# Patient Record
Sex: Female | Born: 1985 | Race: White | Hispanic: No | Marital: Married | Smoking: Former smoker
Health system: Southern US, Community
[De-identification: ages and names within clinical notes are randomized; demographics above are authoritative.]

## PROBLEM LIST (undated history)

## (undated) HISTORY — PX: CHOLECYSTECTOMY: SHX55

## (undated) HISTORY — PX: LAPAROSCOPIC TUBAL LIGATION W/ FILCHIE CLIPS: SHX1938

---

## 2011-01-20 ENCOUNTER — Emergency Department: Payer: Self-pay | Admitting: Emergency Medicine

## 2011-02-14 ENCOUNTER — Emergency Department: Payer: Self-pay | Admitting: Emergency Medicine

## 2011-09-10 ENCOUNTER — Emergency Department (HOSPITAL_COMMUNITY)
Admission: EM | Admit: 2011-09-10 | Discharge: 2011-09-10 | Discharge status: Against Medical Advice | Payer: Medicaid Other | Attending: Emergency Medicine | Admitting: Emergency Medicine

## 2011-09-10 DIAGNOSIS — M545 Low back pain, unspecified: Secondary | ICD-10-CM | POA: Insufficient documentation

## 2011-09-10 DIAGNOSIS — R42 Dizziness and giddiness: Secondary | ICD-10-CM | POA: Insufficient documentation

## 2011-09-10 DIAGNOSIS — R110 Nausea: Secondary | ICD-10-CM | POA: Insufficient documentation

## 2011-09-10 DIAGNOSIS — R1032 Left lower quadrant pain: Secondary | ICD-10-CM | POA: Insufficient documentation

## 2011-09-10 LAB — URINALYSIS, ROUTINE W REFLEX MICROSCOPIC
Bilirubin Urine: NEGATIVE
Glucose, UA: NEGATIVE mg/dL
Hgb urine dipstick: NEGATIVE
Specific Gravity, Urine: 1.023 (ref 1.005–1.030)
pH: 7 (ref 5.0–8.0)

## 2011-09-10 LAB — URINE MICROSCOPIC-ADD ON

## 2014-05-31 ENCOUNTER — Emergency Department: Payer: Self-pay | Admitting: Emergency Medicine

## 2014-05-31 LAB — URINALYSIS, COMPLETE
BACTERIA: NONE SEEN
Bilirubin,UR: NEGATIVE
Glucose,UR: NEGATIVE mg/dL (ref 0–75)
Ketone: NEGATIVE
Nitrite: NEGATIVE
Ph: 5 (ref 4.5–8.0)
RBC,UR: 172 /HPF (ref 0–5)
SPECIFIC GRAVITY: 1.035 (ref 1.003–1.030)
SQUAMOUS EPITHELIAL: NONE SEEN
WBC UR: 1485 /HPF (ref 0–5)

## 2014-06-02 LAB — URINE CULTURE

## 2014-09-06 ENCOUNTER — Emergency Department: Payer: Self-pay | Admitting: Emergency Medicine

## 2014-09-06 LAB — COMPREHENSIVE METABOLIC PANEL
ALK PHOS: 97 U/L
ANION GAP: 11 (ref 7–16)
Albumin: 3.2 g/dL — ABNORMAL LOW (ref 3.4–5.0)
Albumin: 3.2 g/dL — ABNORMAL LOW (ref 3.4–5.0)
Alkaline Phosphatase: 92 U/L
Anion Gap: 8 (ref 7–16)
BILIRUBIN TOTAL: 0.5 mg/dL (ref 0.2–1.0)
BUN: 13 mg/dL (ref 7–18)
BUN: 12 mg/dL (ref 7–18)
Bilirubin,Total: 0.5 mg/dL (ref 0.2–1.0)
CALCIUM: 8.1 mg/dL — AB (ref 8.5–10.1)
CHLORIDE: 108 mmol/L — AB (ref 98–107)
CO2: 25 mmol/L (ref 21–32)
CREATININE: 0.72 mg/dL (ref 0.60–1.30)
Calcium, Total: 7.9 mg/dL — ABNORMAL LOW (ref 8.5–10.1)
Chloride: 102 mmol/L (ref 98–107)
Co2: 21 mmol/L (ref 21–32)
Creatinine: 0.73 mg/dL (ref 0.60–1.30)
EGFR (African American): >60
EGFR (Non-African Amer.): >60
GLUCOSE: 152 mg/dL — AB (ref 65–99)
Glucose: 107 mg/dL — ABNORMAL HIGH (ref 65–99)
OSMOLALITY: 282 (ref 275–301)
Osmolality: 270 (ref 275–301)
POTASSIUM: 3.9 mmol/L (ref 3.5–5.1)
POTASSIUM: 3.4 mmol/L — AB (ref 3.5–5.1)
SGOT(AST): 13 U/L — ABNORMAL LOW (ref 15–37)
SGOT(AST): 20 U/L (ref 15–37)
SGPT (ALT): 26 U/L
SGPT (ALT): 29 U/L
SODIUM: 140 mmol/L (ref 136–145)
Sodium: 135 mmol/L — ABNORMAL LOW (ref 136–145)
TOTAL PROTEIN: 7.3 g/dL (ref 6.4–8.2)
TOTAL PROTEIN: 7.4 g/dL (ref 6.4–8.2)

## 2014-09-06 LAB — LIPASE, BLOOD
LIPASE: 49 U/L — AB (ref 73–393)
LIPASE: 37 U/L — AB (ref 73–393)

## 2014-09-06 LAB — CBC WITH DIFFERENTIAL/PLATELET
BASOS PCT: 0.1 %
Basophil #: 0 10*3/uL (ref 0.0–0.1)
EOS PCT: 0.2 %
Eosinophil #: 0 10*3/uL (ref 0.0–0.7)
HCT: 45.3 % (ref 35.0–47.0)
HGB: 14.1 g/dL (ref 12.0–16.0)
LYMPHS ABS: 1.3 10*3/uL (ref 1.0–3.6)
Lymphocyte %: 5.2 %
MCH: 26.3 pg (ref 26.0–34.0)
MCHC: 31.2 g/dL — AB (ref 32.0–36.0)
MCV: 84 fL (ref 80–100)
MONOS PCT: 6.8 %
Monocyte #: 1.7 x10 3/mm — ABNORMAL HIGH (ref 0.2–0.9)
NEUTROS ABS: 22 10*3/uL — AB (ref 1.4–6.5)
Neutrophil %: 87.7 %
PLATELETS: 412 10*3/uL (ref 150–440)
RBC: 5.37 10*6/uL — ABNORMAL HIGH (ref 3.80–5.20)
RDW: 15.7 % — ABNORMAL HIGH (ref 11.5–14.5)
WBC: 25.1 10*3/uL — AB (ref 3.6–11.0)

## 2014-09-06 LAB — CBC
HCT: 47.6 % — ABNORMAL HIGH (ref 35.0–47.0)
HGB: 14.5 g/dL (ref 12.0–16.0)
MCH: 25.9 pg — ABNORMAL LOW (ref 26.0–34.0)
MCHC: 30.4 g/dL — AB (ref 32.0–36.0)
MCV: 85 fL (ref 80–100)
PLATELETS: 409 10*3/uL (ref 150–440)
RBC: 5.58 10*6/uL — ABNORMAL HIGH (ref 3.80–5.20)
RDW: 16 % — ABNORMAL HIGH (ref 11.5–14.5)
WBC: 18.7 10*3/uL — ABNORMAL HIGH (ref 3.6–11.0)

## 2014-09-06 LAB — URINALYSIS, COMPLETE
Bilirubin,UR: NEGATIVE
Blood: NEGATIVE
Glucose,UR: NEGATIVE mg/dL (ref 0–75)
KETONE: NEGATIVE
Leukocyte Esterase: NEGATIVE
Nitrite: NEGATIVE
Ph: 5 (ref 4.5–8.0)
Protein: NEGATIVE
RBC,UR: 2 /HPF (ref 0–5)
SPECIFIC GRAVITY: 1.013 (ref 1.003–1.030)

## 2014-09-09 ENCOUNTER — Observation Stay: Payer: Self-pay | Admitting: Surgery

## 2014-09-09 LAB — COMPREHENSIVE METABOLIC PANEL
ALK PHOS: 75 U/L
ALT: 24 U/L
Albumin: 2.8 g/dL — ABNORMAL LOW (ref 3.4–5.0)
Anion Gap: 9 (ref 7–16)
BILIRUBIN TOTAL: 0.5 mg/dL (ref 0.2–1.0)
BUN: 7 mg/dL (ref 7–18)
CALCIUM: 7.9 mg/dL — AB (ref 8.5–10.1)
CHLORIDE: 102 mmol/L (ref 98–107)
CO2: 27 mmol/L (ref 21–32)
Creatinine: 0.57 mg/dL — ABNORMAL LOW (ref 0.60–1.30)
EGFR (Non-African Amer.): >60
Glucose: 83 mg/dL (ref 65–99)
OSMOLALITY: 273 (ref 275–301)
POTASSIUM: 3.2 mmol/L — AB (ref 3.5–5.1)
SGOT(AST): 21 U/L (ref 15–37)
Sodium: 138 mmol/L (ref 136–145)
TOTAL PROTEIN: 6.6 g/dL (ref 6.4–8.2)

## 2014-09-09 LAB — CBC WITH DIFFERENTIAL/PLATELET
Basophil #: 0 10*3/uL (ref 0.0–0.1)
Basophil %: 0.4 %
Eosinophil #: 0.3 10*3/uL (ref 0.0–0.7)
Eosinophil %: 4.1 %
HCT: 36 % (ref 35.0–47.0)
HGB: 12 g/dL (ref 12.0–16.0)
LYMPHS PCT: 21.4 %
Lymphocyte #: 1.6 10*3/uL (ref 1.0–3.6)
MCH: 27.6 pg (ref 26.0–34.0)
MCHC: 33.3 g/dL (ref 32.0–36.0)
MCV: 83 fL (ref 80–100)
Monocyte #: 1.1 x10 3/mm — ABNORMAL HIGH (ref 0.2–0.9)
Monocyte %: 14.9 %
Neutrophil #: 4.5 10*3/uL (ref 1.4–6.5)
Neutrophil %: 59.2 %
Platelet: 315 10*3/uL (ref 150–440)
RBC: 4.35 10*6/uL (ref 3.80–5.20)
RDW: 15 % — AB (ref 11.5–14.5)
WBC: 7.6 10*3/uL (ref 3.6–11.0)

## 2014-09-09 LAB — BASIC METABOLIC PANEL
ANION GAP: 5 — AB (ref 7–16)
BUN: 3 mg/dL — ABNORMAL LOW (ref 7–18)
CALCIUM: 7.4 mg/dL — AB (ref 8.5–10.1)
CHLORIDE: 107 mmol/L (ref 98–107)
CO2: 30 mmol/L (ref 21–32)
Creatinine: 0.6 mg/dL (ref 0.60–1.30)
EGFR (African American): >60
EGFR (Non-African Amer.): >60
GLUCOSE: 95 mg/dL (ref 65–99)
OSMOLALITY: 279 (ref 275–301)
POTASSIUM: 3.7 mmol/L (ref 3.5–5.1)
Sodium: 142 mmol/L (ref 136–145)

## 2014-09-09 LAB — LIPASE, BLOOD: Lipase: 323 U/L (ref 73–393)

## 2014-09-10 LAB — CBC WITH DIFFERENTIAL/PLATELET
BASOS ABS: 0 10*3/uL (ref 0.0–0.1)
Basophil %: 0.2 %
EOS PCT: 0.1 %
Eosinophil #: 0 10*3/uL (ref 0.0–0.7)
HCT: 30.8 % — AB (ref 35.0–47.0)
HGB: 9.8 g/dL — AB (ref 12.0–16.0)
LYMPHS PCT: 16.3 %
Lymphocyte #: 1 10*3/uL (ref 1.0–3.6)
MCH: 26.7 pg (ref 26.0–34.0)
MCHC: 31.7 g/dL — AB (ref 32.0–36.0)
MCV: 84 fL (ref 80–100)
MONO ABS: 0.7 x10 3/mm (ref 0.2–0.9)
Monocyte %: 11.6 %
NEUTROS ABS: 4.5 10*3/uL (ref 1.4–6.5)
NEUTROS PCT: 71.8 %
PLATELETS: 291 10*3/uL (ref 150–440)
RBC: 3.65 10*6/uL — ABNORMAL LOW (ref 3.80–5.20)
RDW: 15 % — AB (ref 11.5–14.5)
WBC: 6.2 10*3/uL (ref 3.6–11.0)

## 2014-09-10 LAB — COMPREHENSIVE METABOLIC PANEL
ANION GAP: 6 — AB (ref 7–16)
Albumin: 2.5 g/dL — ABNORMAL LOW (ref 3.4–5.0)
Alkaline Phosphatase: 63 U/L
BILIRUBIN TOTAL: 0.2 mg/dL (ref 0.2–1.0)
BUN: 5 mg/dL — AB (ref 7–18)
CHLORIDE: 105 mmol/L (ref 98–107)
CO2: 29 mmol/L (ref 21–32)
CREATININE: 0.53 mg/dL — AB (ref 0.60–1.30)
Calcium, Total: 7.9 mg/dL — ABNORMAL LOW (ref 8.5–10.1)
GLUCOSE: 149 mg/dL — AB (ref 65–99)
Osmolality: 279 (ref 275–301)
POTASSIUM: 4 mmol/L (ref 3.5–5.1)
SGOT(AST): 34 U/L (ref 15–37)
SGPT (ALT): 36 U/L
Sodium: 140 mmol/L (ref 136–145)
Total Protein: 5.9 g/dL — ABNORMAL LOW (ref 6.4–8.2)

## 2014-09-11 ENCOUNTER — Inpatient Hospital Stay: Payer: Self-pay | Admitting: Surgery

## 2014-09-11 LAB — COMPREHENSIVE METABOLIC PANEL
ALK PHOS: 79 U/L
ANION GAP: 9 (ref 7–16)
Albumin: 2.7 g/dL — ABNORMAL LOW (ref 3.4–5.0)
BUN: 10 mg/dL (ref 7–18)
Bilirubin,Total: 0.2 mg/dL (ref 0.2–1.0)
CO2: 26 mmol/L (ref 21–32)
CREATININE: 0.62 mg/dL (ref 0.60–1.30)
Calcium, Total: 8.1 mg/dL — ABNORMAL LOW (ref 8.5–10.1)
Chloride: 106 mmol/L (ref 98–107)
EGFR (African American): >60
Glucose: 87 mg/dL (ref 65–99)
OSMOLALITY: 280 (ref 275–301)
Potassium: 3.3 mmol/L — ABNORMAL LOW (ref 3.5–5.1)
SGOT(AST): 104 U/L — ABNORMAL HIGH (ref 15–37)
SGPT (ALT): 121 U/L — ABNORMAL HIGH
Sodium: 141 mmol/L (ref 136–145)
Total Protein: 6.2 g/dL — ABNORMAL LOW (ref 6.4–8.2)

## 2014-09-11 LAB — LIPASE, BLOOD
LIPASE: 2525 U/L — AB (ref 73–393)
Lipase: 1550 U/L — ABNORMAL HIGH (ref 73–393)

## 2014-09-11 LAB — CBC
HCT: 31.6 % — AB (ref 35.0–47.0)
HGB: 9.8 g/dL — ABNORMAL LOW (ref 12.0–16.0)
MCH: 26.1 pg (ref 26.0–34.0)
MCHC: 31.1 g/dL — AB (ref 32.0–36.0)
MCV: 84 fL (ref 80–100)
Platelet: 329 10*3/uL (ref 150–440)
RBC: 3.76 10*6/uL — AB (ref 3.80–5.20)
RDW: 15.6 % — AB (ref 11.5–14.5)
WBC: 11.4 10*3/uL — AB (ref 3.6–11.0)

## 2014-09-12 LAB — CBC WITH DIFFERENTIAL/PLATELET
BASOS ABS: 0.1 10*3/uL (ref 0.0–0.1)
BASOS PCT: 0.7 %
EOS ABS: 0.5 10*3/uL (ref 0.0–0.7)
EOS PCT: 4.9 %
HCT: 33 % — ABNORMAL LOW (ref 35.0–47.0)
HGB: 10.8 g/dL — ABNORMAL LOW (ref 12.0–16.0)
Lymphocyte #: 2.7 10*3/uL (ref 1.0–3.6)
Lymphocyte %: 28.9 %
MCH: 27.8 pg (ref 26.0–34.0)
MCHC: 32.8 g/dL (ref 32.0–36.0)
MCV: 85 fL (ref 80–100)
MONO ABS: 0.9 x10 3/mm (ref 0.2–0.9)
Monocyte %: 10.2 %
Neutrophil #: 5.1 10*3/uL (ref 1.4–6.5)
Neutrophil %: 55.3 %
PLATELETS: 327 10*3/uL (ref 150–440)
RBC: 3.9 10*6/uL (ref 3.80–5.20)
RDW: 15.5 % — AB (ref 11.5–14.5)
WBC: 9.2 10*3/uL (ref 3.6–11.0)

## 2014-09-12 LAB — COMPREHENSIVE METABOLIC PANEL
ANION GAP: 9 (ref 7–16)
AST: 74 U/L — AB (ref 15–37)
Albumin: 2.6 g/dL — ABNORMAL LOW (ref 3.4–5.0)
Alkaline Phosphatase: 88 U/L
BILIRUBIN TOTAL: 0.2 mg/dL (ref 0.2–1.0)
BUN: 4 mg/dL — AB (ref 7–18)
CALCIUM: 8 mg/dL — AB (ref 8.5–10.1)
CHLORIDE: 108 mmol/L — AB (ref 98–107)
CREATININE: 0.53 mg/dL — AB (ref 0.60–1.30)
Co2: 26 mmol/L (ref 21–32)
GLUCOSE: 80 mg/dL (ref 65–99)
OSMOLALITY: 281 (ref 275–301)
Potassium: 3.6 mmol/L (ref 3.5–5.1)
SGPT (ALT): 137 U/L — ABNORMAL HIGH
Sodium: 143 mmol/L (ref 136–145)
Total Protein: 6 g/dL — ABNORMAL LOW (ref 6.4–8.2)

## 2014-09-12 LAB — LIPASE, BLOOD: LIPASE: 1334 U/L — AB (ref 73–393)

## 2014-09-12 LAB — CLOSTRIDIUM DIFFICILE(ARMC)

## 2014-09-13 LAB — PATHOLOGY REPORT

## 2014-09-13 LAB — LIPASE, BLOOD: Lipase: 858 U/L — ABNORMAL HIGH (ref 73–393)

## 2015-03-17 NOTE — Discharge Summary (Signed)
PATIENT NAME:  Jasmine Bond, Jasmine Bond MR#:  [id number] DATE OF BIRTH:  [birth year]  DATE OF ADMISSION:  09/09/2014 DATE OF DISCHARGE:  09/10/2014  DISCHARGE DIAGNOSES: Biliary colic with gallstones, obesity.   PROCEDURES: Laparoscopic cholecystectomy.   HISTORY OF PRESENT ILLNESS AND HOSPITAL COURSE:  This is a patient who has been to the emergency room several times. She is in the process of moving back to  to be near family and has had recurrent episodic right upper quadrant pain, chest pain associated with fatty food intolerance and work-up showing gallstones without obvious acute cholecystitis, but she has had multiple emeses and has become dehydrated. Dr. Egbert GaribaldiBird admitted the patient through the emergency room for her emesis and pain.  She was taken to the operating room where laparoscopic cholecystectomy was performed without incident. She made an uncomplicated postoperative recovery. She is tolerating a regular diet. Her H and H has dropped slightly but her vital signs are stable and that is likely dilutional due to her severe dehydration from multiple vomiting episodes preoperatively.   DISCHARGE INSTRUCTIONS: She is discharged in stable condition on oral analgesics to follow up in our office in 10 days or sooner should she have any problems at all. Again, she is not symptomatic from her anemia, and her liver function tests are normal.    ____________________________ Adah Salvageichard E. Excell Seltzerooper, MD rec:jw D: 09/10/2014 08:27:00 ET T: 09/10/2014 18:39:28 ET JOB#: [id number]  cc: Adah Salvageichard E. Excell Seltzerooper, MD, <Dictator> Lattie HawICHARD E Haset O[place] MD ELECTRONICALLY SIGNED 09/19/2014 23:31

## 2015-03-17 NOTE — Discharge Summary (Signed)
PATIENT NAME:  Jasmine Bond, Jasmine Bond MR#:  [id number] DATE OF BIRTH:  [birth year]  DATE OF ADMISSION:  09/11/2014 DATE OF DISCHARGE:  09/13/2014  DISCHARGE DIAGNOSES:  1. Pancreatitis, likely gallstone.  2. History of recent cholecystectomy on 09/09/2014.  3. History of bilateral tubal ligation.  4. History of wisdom teeth surgery.  5. History of urinary tract infection.   DISCHARGE MEDICATIONS: As follows: Norco 1 tab p.o. q.4 hours p.r.n. pain.   INDICATION FOR ADMISSION: Jasmine Bond is a pleasant, 29 year old female who was recently discharged following cholecystectomy. She presents with elevated lipase and epigastric pain. She was admitted for concern for management of pancreatitis and concern for retained common bile duct stone as no cholangiogram was performed. She also had diarrhea, which is improving.   HOSPITAL COURSE: As follows: Jasmine Bond was admitted. She was made n.p.o., given IV fluids and GI was consulted. Throughout hospital course, her pain improved and her pancreatitis resolved. She had an MRCP, which showed no retained stones. She was discharged in satisfactory condition.    DISCHARGE INSTRUCTIONS: As follows: Jasmine Bond is to follow up in approximately 1 week. She is to call or return to the ED if has increased pain, nausea, vomiting, redness, drainage from incisions.    ____________________________ Si Raiderhristopher A. Yeshaya Vath, MD cal:je D: 09/20/2014 07:44:49 ET T: 09/20/2014 12:53:29 ET JOB#: [id number]  cc: Cristal Deerhristopher A. Anthoney Sheppard, MD, <Dictator> Jarvis NewcomerHRISTOPHER A Arval Brandstetter MD ELECTRONICALLY SIGNED 09/26/2014 7:01

## 2015-03-17 NOTE — Consult Note (Signed)
Chief Complaint:  Subjective/Chief Complaint seen for pancreatitis-paint this evening is 6/10.  no emesis, some nausea this am, not now.  passing flatus.   VITAL SIGNS/ANCILLARY NOTES: **Vital Signs.:   20-Oct-15 16:34  Vital Signs Type Q 4hr  Temperature Temperature (F) 98.1  Celsius 36.7  Temperature Source oral  Respirations Respirations 18  Systolic BP Systolic BP 108  Diastolic BP (mmHg) Diastolic BP (mmHg) 71  Mean BP 83  Pulse Ox % Pulse Ox % 99  Pulse Ox Activity Level  At rest  Oxygen Delivery Room Air/ 21 %  *Intake and Output.:   20-Oct-15 04:36  Stool  large semi soft stool   Brief Assessment:  Cardiac Regular   Respiratory clear BS   Gastrointestinal details normal Soft  Nondistended  positive but distant bowel sounds.  tender to palpation at central port site/epigastrum.   Lab Results: Hepatic:  19-Oct-15 06:16   Bilirubin, Total 0.2  Alkaline Phosphatase 79 (46-116 NOTE: New Reference Range 06/13/14)  SGPT (ALT)  121 (14-63 NOTE: New Reference Range 06/13/14)  SGOT (AST)  104  Total Protein, Serum  6.2  Albumin, Serum  2.7  20-Oct-15 04:59   Bilirubin, Total 0.2  Alkaline Phosphatase 88 (46-116 NOTE: New Reference Range 06/13/14)  SGPT (ALT)  137 (14-63 NOTE: New Reference Range 06/13/14)  SGOT (AST)  74  Total Protein, Serum  6.0  Albumin, Serum  2.6  Routine Hem:  19-Oct-15 06:16   WBC (CBC)  11.4  20-Oct-15 04:59   WBC (CBC) 9.2   Radiology Results: MRI:    19-Oct-15 17:47, MRCP MR Cholangiogram  MRCP MR Cholangiogram   REASON FOR EXAM:    pancreatitis, eval for retained stone  COMMENTS:       PROCEDURE: MR  - MR MRCP  - Sep 11 2014  5:47PM     CLINICAL DATA:  Status post cholecystectomy on Saturday, now with  abdominal pain    EXAM:  MRI ABDOMEN WITHOUT AND WITH CONTRAST (INCLUDING MRCP)    TECHNIQUE:  Multiplanar multisequence MR imaging of the abdomen was performed  both before and after the administration of  intravenous contrast.  Heavily T2-weighted images of the biliary and pancreatic ducts were  obtained,and three-dimensional MRCP images were rendered by post  processing.    CONTRAST:  20 mL Multihance IV    COMPARISON:  Right upper quadrant ultrasound dated 09/09/2014    FINDINGS:  Lower chest:  Lung bases are clear.    Hepatobiliary: Liver is within normal limits. No  suspicious/enhancing hepatic lesions. Suspected mild altered  perfusion along the gallbladder fossa (series 17/image 58).  Suspected hemangioma on ultrasound is not evident on MRI.  Status post cholecystectomy. No drainable fluid collection/ seroma  in the surgical bed.    No intrahepatic ductal dilatation. Common duct measures 7.5 mm but  smoothly tapers at the ampulla. No choledocholithiasis is seen.    Pancreas: Within normal limits.    Spleen: Within normal limits.    Adrenals/Urinary Tract: Adrenal glands are unremarkable.    Kidneys are within normal limits.  No hydronephrosis.    Stomach/Bowel: Stomach is unremarkable.  Visualized bowel is within normal limits.    Vascular/Lymphatic: No abdominal aortic aneurysm.    No suspicious abdominal lymphadenopathy.    Other: Trace fluid along the inferior hepatic margin (series 6/image  4).    Musculoskeletal: No focal osseous lesions.     IMPRESSION:  Status post cholecystectomy.    No drainable fluid collection/ seromain the surgical  bed. Trace  infrahepatic ascites.    No intrahepatic ductal dilatation. Common duct measures 7.5 mm but  smoothly tapers at the ampulla. No choledocholithiasis is seen.      Electronically Signed    By: Charline Bills M.D.    On: 09/11/2014 18:33         Verified By: Charline Bills, M.D.,   Assessment/Plan:  Assessment/Plan:  Assessment 1) pancreatitis, likely biliary.  improving, may be able to trial clears tomorrow.   Plan 1) as above.  continue current.   Electronic Signatures: Barnetta Chapel  (MD)  (Signed 20-Oct-15 17:57)  Authored: Chief Complaint, VITAL SIGNS/ANCILLARY NOTES, Brief Assessment, Lab Results, Radiology Results, Assessment/Plan   Last Updated: 20-Oct-15 17:57 by Barnetta Chapel (MD)

## 2015-03-17 NOTE — Consult Note (Signed)
Chief Complaint:  Subjective/Chief Complaint seen for post ccy pancreatitis.  MRCP no evidence of retained cbd stone, but possible passage of sludge or debris causing problem.  pain currently "8/10" but patient appears comfortable, states she is passing flatus, but no stool.  Paucity of bowel sounds, though no high pitch.  mild tenderness in the low epigastric region on exam.  Lipase 1550 this evening, improving.  Continue current. Following.   VITAL SIGNS/ANCILLARY NOTES: **Vital Signs.:   19-Oct-15 19:51  Vital Signs Type Q 4hr  Temperature Temperature (F) 97.4  Celsius 36.3  Pulse Pulse 59  Respirations Respirations 18  Systolic BP Systolic BP 115  Diastolic BP (mmHg) Diastolic BP (mmHg) 74  Mean BP 87  Pulse Ox % Pulse Ox % 98  Pulse Ox Activity Level  At rest  Oxygen Delivery Room Air/ 21 %   Electronic Signatures: Barnetta ChapelSkulskie, Tanajah Boulter (MD)  (Signed 19-Oct-15 20:16)  Authored: Chief Complaint, VITAL SIGNS/ANCILLARY NOTES   Last Updated: 19-Oct-15 20:16 by Barnetta ChapelSkulskie, Rylei Codispoti (MD)

## 2015-03-17 NOTE — Consult Note (Signed)
PATIENT NAME:  Jasmine Bond, [name] MR#:  [id number] DATE OF BIRTH:  31-Dec-1985  DATE OF CONSULTATION:  09/11/2014  REFERRING PHYSICIAN:  Cristal Deerhristopher A. Lundquist, MD  CONSULTING PHYSICIAN:  Keturah Barrehristiane H. Alaa Mullally, NP  REASON FOR CONSULTATION: GI consult ordered by Dr. Juliann PulseLundquist for evaluation of pancreatitis.   HISTORY OF PRESENT ILLNESS: Appreciate consult for A 29 year old Caucasian woman with a history of laparoscopic cholecystectomy, 09/09/2014, by Dr. Excell Seltzerooper for evaluation of pancreatitis. States she did well after surgery and was discharged home yesterday, then developed a strange epigastric pain. York SpanielSaid it is hard to describe.  It does not travel, stays in the upper stomach.  No nausea, vomiting, fever, shortness of breath or other complaints. Today, she reports it is less painful than the right upper quadrant pain, nausea and vomiting that she had with her biliary colic prior to surgery. Was found to have lipase level of 2525 and admitted this morning with pancreatitis, minimal leukocytosis. She states she had diarrhea before the procedure, but this has improved. No bowel movement today. Last was yesterday, was somewhat loose. There is a C. difficile test result pending. LMP now. Surgeon is concerned for possible retained stone. Had IV bolus and fluids ordered. Feeling better with pain medications.   ALLERGIES: No known drug allergies.   PAST MEDICAL AND SURGICAL HISTORY:  Tubal ligation, cholecystectomy, UTI,   kidney infection.  FAMILY HISTORY: Gallbladder disease in several family members. No colon cancer or other GI illnesses.   SOCIAL HISTORY: No tobacco, rare alcohol. No illicits. Does say she stopped smoking a week ago.   REVIEW OF SYSTEMS:  Ten systems reviewed, unremarkable other than what is noted above.   MEDICATIONS: No regular medications. Is taking p.r.n. Vicodin at home.   LABORATORY DATA: Most recent labs: Glucose 87, BUN 10, creatinine 0.62, sodium 141, potassium 3.3, GFR greater  than 60, calcium 8.1, lipase 2525, total protein 6.2, albumin 2.7, total bilirubin 0.2, ALP 79, AST 104, ALT 121, WBC 11.4, hemoglobin 9.8, hematocrit 31.6, platelet count 329,000.    PHYSICAL EXAMINATION:  MOST RECENT VITAL SIGNS: Temperature 97.5, pulse 63, respiratory rate 16, blood pressure 101/68, oxygen saturation 99% on room air.  GENERAL: Well-appearing young woman resting comfortably in no acute distress in bed.  HEENT: Normocephalic, atraumatic. Sclerae clear, conjunctivae pink.  NECK: Supple. No JVD or thyromegaly.  CHEST: Respirations eupneic. Lungs clear.  CARDIAC: S1, S2, RRR. No MRG. No appreciable edema.  ABDOMEN: Five small incisions with Steri-Strips intact, clean, and dry. A small bruise to the left side of the abdomen, soft. Bowel sounds x 4. Tenderness around the epigastrium. No guarding. No rigidity. No rebound tenderness or peritoneal signs. Unable to appreciate hepatosplenomegaly.  SKIN: Warm, dry, pink. No erythema, lesion, or rash. Incision shows above.  NEUROLOGICAL: Alert, oriented x 3. Speech clear. No facial droop.  PSYCHIATRIC: Calm, cooperative. Reasonable insight.   IMPRESSION AND PLAN: Pancreatitis. Agree with suspicion for  retained stone or sludge. Agree with present orders for fluids and pain management. Recommend MRCP. If evidence of stone, may require a ERCP.   Thank you very much for this consult.   These services were provided by Vevelyn Pathristiane Kele Barthelemy, NP, under collaborative agreement with Christena DeemMartin U. Skulskie, MD with whom I discussed this patient in full.    ____________________________ Keturah Barrehristiane H. Tahani Potier, NP chl:LT D: 09/11/2014 17:10:00 ET T: 09/11/2014 17:42:15 ET JOB#: [id number]  cc: Keturah Barrehristiane H. Zarianna Dicarlo, NP, <Dictator> Eustaquio MaizeHRISTIANE H Brea Coleson FNP ELECTRONICALLY SIGNED 10/16/2014 17:11

## 2015-03-17 NOTE — Op Note (Signed)
PATIENT NAME:  Margit BandaSHULL, Reyne MR#:  [id number] DATE OF BIRTH:  [birth year]  DATE OF PROCEDURE:  09/09/2014  PREOPERATIVE DIAGNOSIS: Symptomatic cholelithiasis.   POSTOPERATIVE DIAGNOSIS: Symptomatic cholelithiasis.   PROCEDURE: Laparoscopic cholecystectomy.   SURGEON: Nashea Chumney E. Excell Seltzerooper, MD.   ANESTHESIA: General with endotracheal tube.   INDICATIONS: This is a patient with a history of recurrent epigastric and right upper quadrant pain with multiple episodes of nausea and vomiting. Her pain and nausea has been unrelenting at this ER visit; therefore, we decided to proceed with laparoscopic cholecystectomy. I discussed with her the options and the rationale for offering surgery at this point, which she chose and the risks of bleeding, infection, conversion to an open procedure, bile duct damage, bile duct leak, retained common bile duct stone or bowel injury requiring additional surgery. She understood and agreed to proceed.   FINDINGS: Edema of the gallbladder, a small cystic duct and gallstones.   DESCRIPTION OF PROCEDURE: The patient was induced with general anesthesia, given IV antibiotics. VTE prophylaxis was in place. She was prepped and draped in a sterile fashion. Marcaine was infiltrated in the skin and subcutaneous tissues around the periumbilical area. Incision was made. Veress needle was placed. Pneumoperitoneum was obtained. A 5 mm trocar port was placed. The abdominal cavity was explored and under direct vision, a 10 mm epigastric port and 2 lateral 5 mm ports were placed. The gallbladder was placed on tension. The peritoneum over the infundibulum was incised bluntly. The cystic duct and gallbladder junction was well identified. The cystic lymphatics were doubly clipped and divided. This allowed for good visualization of the cystic duct as it entered the infundibulum. Here, it was doubly clipped and divided and then the cystic artery was doubly clipped and divided.  The gallbladder was  taken from the gallbladder fossa with electrocautery and passed out through the epigastric port site with the aid of an Endo Catch bag. The area was checked for hemostasis. There was no sign of bleeding, bile leak or bowel injury; therefore, the camera was placed in the epigastric site to view back to the periumbilical site. There is no sign of bowel injury. Irrigation was performed and again checked for hemostasis and bowel injury and none was identified. Therefore, pneumoperitoneum was released. All ports were removed. Fascial edges at the epigastric site were approximated with figure-of-eight 0 Vicryl, made difficult by this patient's morbid obesity and then 4-0 subcuticular Monocryl was used on all skin edges. Steri-Strips, Mastisol and sterile dressings were placed. The patient tolerated the procedure well. There were no complications. She was taken to the recovery room in stable condition to be admitted for continued care.     ____________________________ Adah Salvageichard E. Excell Seltzerooper, MD rec:TT D: 09/09/2014 13:58:02 ET T: 09/09/2014 18:14:10 ET JOB#: [id number]  cc: Adah Salvageichard E. Excell Seltzerooper, MD, <Dictator> Lattie HawICHARD E Rice Walsh MD ELECTRONICALLY SIGNED 09/10/2014 18:59

## 2015-03-17 NOTE — Consult Note (Signed)
Brief Consult Note: Diagnosis: pancreatitis.   Patient was seen by consultant.   Consult note dictated.   Comments: Appreciate consult for 28y/o caucasian woman with history of lap chole 09/09/14 by Dr Excell Seltzerooper, for evaluation of pancreatitis: states she did well after surgery and was dc'd home yesterday, then developed a strange epigastric pain: says it is hard to describe. Doesn't travel, stays in the upper stomach. No NV, fever, sob, or other complaints. Today she reports it is less painful that the RUQpain/NV that she had with her biliary colic prior to surgery. Was found to have lipase level 2525 and admitted this am with pancreatitis. Minimal leukocytosis. States she had diarrhea before the procedure, no bm today: last was yest & loose. c-diff test ordered. LMP now. Surgeon is concerned for possible retained stone. Had iv bolus and fluids ordered. Feeling better with pain medication.  Impression/plaN: Pancreatitis: agree with suspicion of retained stone or sludge. Agree with present orders for fluids and pain mgmt.  Recommend MRCP- ir evidence of stone, may require ERCP.  Electronic Signatures: Vevelyn PatLondon, Halim Surrette H (NP)  (Signed 19-Oct-15 14:34)  Authored: Brief Consult Note   Last Updated: 19-Oct-15 14:34 by Keturah BarreLondon, Aleasha Fregeau H (NP)

## 2015-03-17 NOTE — H&P (Signed)
   Subjective/Chief Complaint Epigastric pain x 1 day, persistent diarrhea   History of Present Illness Jasmine Bond is a pleasant 29 yo F who presents with 1 day of severe epigastric pain s/p cholecystectomy.  She says that her pain is worse than her RUQ pain which was postprandial and which resolved following cholecystectomy.  Tolerating diet now.  + diarrhea which is resolving.  Pain improved now with pain meds.  Radiates to back.  Lipase is 2500, was mildly elevated to 300s prior to surgery.  Denies EtOH use   Past History S/p cholecystectomy on 10/17 H/o BTL H/o wisdom teeth surgery H/o UTI   Code Status Full Code   Past Med/Surgical Hx:  kidney infection:   Urinary Tract Infection:   Gall Bladder:   Tubal Ligation:   ALLERGIES:  No Known Allergies:   Family and Social History:  Family History Non-Contributory   Social History negative tobacco, negative ETOH   + Tobacco Current (within 1 year)   Place of Living Home   Review of Systems:  Subjective/Chief Complaint Epigastric pain ,diarrhea   Fever/Chills Yes  Chills   Cough No   Sputum No   Abdominal Pain Yes   Diarrhea Yes   Constipation No   Nausea/Vomiting No   SOB/DOE No   Dysuria No   Tolerating PT Yes   Tolerating Diet Yes   Physical Exam:  GEN well developed, well nourished, no acute distress, obese   HEENT pink conjunctivae, PERRL, moist oral mucosa   RESP normal resp effort  clear BS  no use of accessory muscles   CARD regular rate   ABD positive tenderness  no liver/spleen enlargement  no hernia  soft  normal BS   EXTR negative cyanosis/clubbing, negative edema   SKIN normal to palpation, No rashes, No ulcers   NEURO cranial nerves intact, negative tremor, follows commands, strength:, motor/sensory function intact   PSYCH A+O to time, place, person, good insight    Assessment/Admission Diagnosis Jasmine Bond is a pleasant 29 yo F who presents with acute pancreatitis. Concern for  retained CBD stone as no cholangiogram performed.  Also with resolving diarrhea.   Plan NPO, IVF, GI consult.  Stool for c diff.  Labs in am.   Electronic Signatures: Jarvis NewcomerLundquist, Nysa Sarin A (MD)  (Signed 19-Oct-15 08:00)  Authored: CHIEF COMPLAINT and HISTORY, PAST MEDICAL/SURGIAL HISTORY, ALLERGIES, FAMILY AND SOCIAL HISTORY, REVIEW OF SYSTEMS, PHYSICAL EXAM, ASSESSMENT AND PLAN   Last Updated: 19-Oct-15 08:00 by Jarvis NewcomerLundquist, Desean Heemstra A (MD)

## 2015-03-17 NOTE — H&P (Signed)
Subjective/Chief Complaint abdominal pain, nausea and vomiting.   History of Present Illness 29 y/o female presents to this ER for a third visit in less than a week with post-prandial epigastric and RUQ pain associated with nausea, vomiting and some diarrhea.  Denies prior similar episodes.  Work-up here initially showed WBC 25K, nl LFT's and [place] with large gallstone.  She is in the process of moving to [place] and was seen in follow-up at her PCP in [place] who then referred her to a surgeon and she is actually supposed to have cholecystectomy Monday in [place], [place].  She signed herself out there at the time of the visit as surgery was scheduled the next day actually due to social reasons and returned to [facility].  Presents last night with recurrent epigastric and RUQ abdominal pain radiating into  her back again with n/v/d.  No fevers, no jaundice, no sick contacts.  She reports that an US done in [place] showed pericholecystic fluid.  repeated US done at this most recent visit only shows stones.  LFT"s nl and WBC normal but K 3.2.  Unable to control her pain and nausea with oral meds.   Past History B[facility] [place] teeth surgery   Code Status Full Code   Past Med/Surgical Hx:  kidney infection:   Urinary Tract Infection:   Tubal Ligation:   ALLERGIES:  No Known Allergies:   HOME MEDICATIONS: Medication Instructions Status  acetaminophen-HYDROcodone 325 mg-5 mg oral tablet 1-2 tab(s) by mouth every 4-6 hours as needed for severe pain. Active  Zofran ODT 4 mg oral tablet, disintegrating 1 tab(s)  every 8 hours as needed   Active  Levaquin 500 mg oral tablet 1 tab(s) orally every 24 hours Active   Family and Social History:  Family History Non-Contributory   Social History negative tobacco, negative ETOH, negative Illicit drugs   Place of Living Home   Review of Systems:  Subjective/Chief Complaint see HPI details above.   Tolerating Diet No  Nauseated  Vomiting   Physical Exam:  GEN no acute distress,  obese, disheveled   HEENT pale conjunctivae, PERRL   NECK supple   RESP normal resp effort  clear BS   CARD regular rate   ABD positive tenderness  denies tenderness  soft  tender in epigastrum.   LYMPH negative neck   EXTR negative cyanosis/clubbing   SKIN normal to palpation   NEURO cranial nerves intact   PSYCH A+O to time, place, person, good insight   Lab Results: Hepatic:  17-Oct-15 02:00   Bilirubin, Total 0.5  Alkaline Phosphatase 75 (46-116 NOTE: New Reference Range 06/13/14)  SGPT (ALT) 24 (14-63 NOTE: New Reference Range 06/13/14)  SGOT (AST) 21  Total Protein, Serum 6.6  Albumin, Serum  2.8  Routine Chem:  17-Oct-15 02:00   Lipase 323 (Result(s) reported on 09 Sep 2014 at 02:55AM.)  Glucose, Serum 83  BUN 7  Creatinine (comp)  0.57  Sodium, Serum 138  Potassium, Serum  3.2  Chloride, Serum 102  CO2, Serum 27  Calcium (Total), Serum  7.9  Osmolality (calc) 273  eGFR (African American) >60  eGFR (Non-African American) >60 (eGFR values <41m/min/1.73 m2 may be an indication of chronic kidney disease (CKD). Calculated eGFR, using the MRDR Study equation, is useful in  patients with stable renal function. The eGFR calculation will not be reliable in acutely ill patients when serum creatinine is changing rapidly. It is not useful in patients on dialysis. The eGFR calculation may not  be applicable to patients at the low and high extremes of body sizes, pregnant women, and vetetarians.)  Anion Gap 9  Routine Hem:  17-Oct-15 02:00   WBC (CBC) 7.6  RBC (CBC) 4.35  Hemoglobin (CBC) 12.0  Hematocrit (CBC) 36.0  Platelet Count (CBC) 315  MCV 83  MCH 27.6  MCHC 33.3  RDW  15.0  Neutrophil % 59.2  Lymphocyte % 21.4  Monocyte % 14.9  Eosinophil % 4.1  Basophil % 0.4  Neutrophil # 4.5  Lymphocyte # 1.6  Monocyte #  1.1  Eosinophil # 0.3  Basophil # 0.0 (Result(s) reported on 09 Sep 2014 at 02:14AM.)   Radiology Results: [place]:    17-Oct-15  03:21, US Abdomen Limited Survey  US Abdomen Limited Survey  REASON FOR EXAM:    persistent N/V and pain w/ known cholelithiasis, r/o   acute cholecystitis  COMMENTS:   Body Site: Gallbladder, Liver, Common Bile Duct    PROCEDURE: [place]  - US ABDOMEN LIMITED SURVEY  - Sep 09 2014  3:21AM     CLINICAL DATA:  Persistent nausea and vomiting. Pain and known  cholelithiasis. Evaluate for cholecystitis.    EXAM:  US ABDOMEN LIMITED - RIGHT UPPER QUADRANT    COMPARISON:  09/07/2014    FINDINGS:  Gallbladder:    Mobile gallstone.  No wall thickening or focal tenderness.    Common bile duct:    Diameter: 5 to 6 mm, which is borderline dilated. Where visualized,  no filling defect.    Liver:    There is echogenic mass within the central right liver measuring 1  cm. No shadowing or halo.    Trace right pleural effusion.   IMPRESSION:  1. Mobile cholelithiasis.  No acute cholecystitis.  2. Trace right pleural effusion.  3. 1 cm mass in the right liver, appearance favoring hemangioma.      Electronically Signed    By: Jorje Guild M.D.    On: 09/09/2014 03:34         Verified By: Gilford Silvius, M.D.,  LabUnknown:  PACS Image    Assessment/Admission Diagnosis 29 y/o female with biliary colic and failure of OP management. hypokalemia secondary to GI losses.   Plan admit, hydrate and replete K Will discuss feasibility of surgical intervention during this admission.   Electronic Signatures: Sherri Rad (MD)  (Signed 17-Oct-15 06:28)  Authored: CHIEF COMPLAINT and HISTORY, PAST MEDICAL/SURGIAL HISTORY, ALLERGIES, HOME MEDICATIONS, FAMILY AND SOCIAL HISTORY, REVIEW OF SYSTEMS, PHYSICAL EXAM, LABS, Radiology, ASSESSMENT AND PLAN   Last Updated: 17-Oct-15 06:28 by Sherri Rad (MD)

## 2015-05-20 ENCOUNTER — Emergency Department

## 2015-05-20 ENCOUNTER — Encounter: Payer: Self-pay | Admitting: Emergency Medicine

## 2015-05-20 ENCOUNTER — Emergency Department
Admission: EM | Admit: 2015-05-20 | Discharge: 2015-05-21 | Disposition: A | Discharge status: Home / Self Care | Attending: Emergency Medicine | Admitting: Emergency Medicine

## 2015-05-20 DIAGNOSIS — Z3202 Encounter for pregnancy test, result negative: Secondary | ICD-10-CM | POA: Insufficient documentation

## 2015-05-20 DIAGNOSIS — R102 Pelvic and perineal pain: Secondary | ICD-10-CM | POA: Diagnosis not present

## 2015-05-20 DIAGNOSIS — Z720 Tobacco use: Secondary | ICD-10-CM | POA: Insufficient documentation

## 2015-05-20 LAB — URINALYSIS COMPLETE WITH MICROSCOPIC (ARMC ONLY)
Bacteria, UA: NONE SEEN
Bilirubin Urine: NEGATIVE
GLUCOSE, UA: NEGATIVE mg/dL
Hgb urine dipstick: NEGATIVE
KETONES UR: NEGATIVE mg/dL
Leukocytes, UA: NEGATIVE
Nitrite: NEGATIVE
PH: 5 (ref 5.0–8.0)
PROTEIN: NEGATIVE mg/dL
SPECIFIC GRAVITY, URINE: 1.02 (ref 1.005–1.030)

## 2015-05-20 LAB — ABO/RH: ABO/RH(D): A POS

## 2015-05-20 LAB — CBC
HCT: 39.3 % (ref 35.0–47.0)
Hemoglobin: 12.9 g/dL (ref 12.0–16.0)
MCH: 26.8 pg (ref 26.0–34.0)
MCHC: 32.8 g/dL (ref 32.0–36.0)
MCV: 81.7 fL (ref 80.0–100.0)
Platelets: 382 10*3/uL (ref 150–440)
RBC: 4.81 MIL/uL (ref 3.80–5.20)
RDW: 15.7 % — AB (ref 11.5–14.5)
WBC: 10.8 10*3/uL (ref 3.6–11.0)

## 2015-05-20 LAB — POCT PREGNANCY, URINE: PREG TEST UR: NEGATIVE

## 2015-05-20 LAB — HCG, QUANTITATIVE, PREGNANCY: hCG, Beta Chain, Quant, S: <1 m[IU]/mL (ref <5)

## 2015-05-20 NOTE — ED Provider Notes (Signed)
[facility] Emergency Department Provider Note  ____________________________________________  Time seen: Approximately 11:03 PM  I have reviewed the triage vital signs and the nursing notes.   HISTORY  Chief Complaint Abdominal Pain and Pelvic Pain    HPI Jasmine Bond is a 29 y.o. female status post bilateral tubal ligation and cholecystectomy who presents with pelvic pain for several days and some spotting and mucus-like vaginal discharge.  She states that the symptoms were gradual in onset, the pain is mild and intermittent cramping, and it feels like when she has been pregnant in the past.  She adamantly reports that she has 4 living children and in each case her pregnancy tests were negative until she was at about 8 weeks.  She is very concerned that she may be pregnant today in spite of her negative pregnancy test at her tubal ligation.  She has no other abdominal pain, no nausea/vomiting, no chest pain, no shortness of breath, no fever/chills.  She thinks the pain better and nothing makes it worse.  She states that she is overdue for her menstrual period.   History reviewed. No pertinent past medical history.  There are no active problems to display for this patient.   Past Surgical History  Procedure Laterality Date  . Laparoscopic tubal ligation w/ filchie clips    . Cholecystectomy      No current outpatient prescriptions on file.  Allergies Review of patient's allergies indicates no known allergies.  No family history on file.  Social History History  Substance Use Topics  . Smoking status: Current Every Day Smoker -- 1.00 packs/day    Types: Cigarettes  . Smokeless tobacco: Not on file  . Alcohol Use: No    Review of Systems Constitutional: No fever/chills Eyes: No visual changes. ENT: No sore throat. Cardiovascular: Denies chest pain. Respiratory: Denies shortness of breath. Gastrointestinal: Lower pelvic cramping, left worse than  right.  No nausea, no vomiting.  No diarrhea.  No constipation. Genitourinary: Negative for dysuria.  Minimal vaginal discharge with some bleeding Musculoskeletal: Negative for back pain. Skin: Negative for rash. Neurological: Negative for headaches, focal weakness or numbness.  10-point ROS otherwise negative.  ____________________________________________   PHYSICAL EXAM:  VITAL SIGNS: ED Triage Vitals  Enc Vitals Group     BP 05/20/15 2002 130/74 mmHg     Pulse Rate 05/20/15 2002 70     Resp 05/20/15 2002 19     Temp 05/20/15 2002 98.1 F (36.7 C)     Temp Source 05/20/15 2002 Oral     SpO2 05/20/15 2002 98 %     Weight 05/20/15 2002 240 lb (108.863 kg)     Height 05/20/15 2002  (1.626 m)     Head Cir --      Peak Flow --      Pain Score 05/20/15 2005 10     Pain Loc --      Pain Edu? --      Excl. in GC? --     Constitutional: Alert and oriented. Well appearing and in no acute distress. Eyes: Conjunctivae are normal. PERRL. EOMI. Head: Atraumatic. Nose: No congestion/rhinnorhea. Mouth/Throat: Mucous membranes are moist.  Oropharynx non-erythematous. Neck: No stridor.   Cardiovascular: Normal rate, regular rhythm. Grossly normal heart sounds.  Good peripheral circulation. Respiratory: Normal respiratory effort.  No retractions. Lungs CTAB. Gastrointestinal: Obese, Soft and nontender. No distention. No abdominal bruits. No CVA tenderness. Genitourinary:  Deferred at the patient's preference Musculoskeletal: No lower extremity  tenderness nor edema.  No joint effusions. Neurologic:  Normal speech and language. No gross focal neurologic deficits are appreciated. Speech is normal. Skin:  Skin is warm, dry and intact. No rash noted. Psychiatric: Mood and affect are normal. Speech and behavior are normal.  ____________________________________________   LABS (all labs ordered are listed, but only abnormal results are displayed)  Labs Reviewed  CBC - Abnormal;  Notable for the following:    RDW 15.7 (*)    All other components within normal limits  URINALYSIS COMPLETEWITH MICROSCOPIC ([facility] ONLY) - Abnormal; Notable for the following:    Color, Urine YELLOW (*)    APPearance CLEAR (*)    Squamous Epithelial / LPF 0-5 (*)    All other components within normal limits  HCG, QUANTITATIVE, PREGNANCY  POC URINE PREG, ED  POCT PREGNANCY, URINE  ABO/RH  ABO/RH   ____________________________________________  EKG  Not indicated ____________________________________________  RADIOLOGY  US Transvaginal Non-ob  05/21/2015   CLINICAL DATA:  Left pelvic pain, onset last night.  EXAM: TRANSABDOMINAL AND TRANSVAGINAL ULTRASOUND OF PELVIS  TECHNIQUE: Both transabdominal and transvaginal ultrasound examinations of the pelvis were performed. Transabdominal technique was performed for global imaging of the pelvis including uterus, ovaries, adnexal regions, and pelvic cul-de-sac. It was necessary to proceed with endovaginal exam following the transabdominal exam to visualize the ovary.  COMPARISON:  01/20/2011  FINDINGS: Uterus  Measurements: 9.3 x 4.1 x 6.3 cm. No fibroids or other mass visualized.  Endometrium  Thickness: 10.7 mm.  No focal abnormality visualized.  Right ovary  Measurements: 3.5 x 1.9 x 1.9 cm. Normal appearance/no adnexal mass.  Left ovary  Measurements: 3.0 x 1.4 x 1.7 cm. Normal appearance/no adnexal mass.  Other findings  Trace free pelvic fluid.  IMPRESSION: No significant abnormality.   Electronically Signed   By: Ellery Plunk M.D.   On: 05/21/2015 00:35   US Pelvis Complete  05/21/2015   CLINICAL DATA:  Left pelvic pain, onset last night.  EXAM: TRANSABDOMINAL AND TRANSVAGINAL ULTRASOUND OF PELVIS  TECHNIQUE: Both transabdominal and transvaginal ultrasound examinations of the pelvis were performed. Transabdominal technique was performed for global imaging of the pelvis including uterus, ovaries, adnexal regions, and pelvic cul-de-sac. It  was necessary to proceed with endovaginal exam following the transabdominal exam to visualize the ovary.  COMPARISON:  01/20/2011  FINDINGS: Uterus  Measurements: 9.3 x 4.1 x 6.3 cm. No fibroids or other mass visualized.  Endometrium  Thickness: 10.7 mm.  No focal abnormality visualized.  Right ovary  Measurements: 3.5 x 1.9 x 1.9 cm. Normal appearance/no adnexal mass.  Left ovary  Measurements: 3.0 x 1.4 x 1.7 cm. Normal appearance/no adnexal mass.  Other findings  Trace free pelvic fluid.  IMPRESSION: No significant abnormality.   Electronically Signed   By: Ellery Plunk M.D.   On: 05/21/2015 00:35    ____________________________________________   PROCEDURES  Procedure(s) performed: None  Critical Care performed: No ____________________________________________   INITIAL IMPRESSION / ASSESSMENT AND PLAN / ED COURSE  Pertinent labs & imaging results that were available during my care of the patient were reviewed by me and considered in my medical decision making (see chart for details).  The patient is nontender upon abdominal exam.  I discussed with her how unlikely it is that she would be pregnant in the setting of a negative pregnancy test and a history of a tubal ligation, but she is very concerned that this may be happening.  I will obtain a transvaginal ultrasound to  help provide reassurance to her, and I stressed to her the need for outpatient follow-up and explained that we cannot provide an ultrasound every time similar symptoms occur in the future.  She understands and agrees.   (Note that documentation was delayed due to multiple ED patients requiring immediate care.)   The patient's ultrasound was unremarkable.  I gave her my usual and customary return precautions ____________________________________________  FINAL CLINICAL IMPRESSION(S) / ED DIAGNOSES  Final diagnoses:  Pelvic pain in female      NEW MEDICATIONS STARTED DURING THIS VISIT:  New Prescriptions    No medications on file     Loleta Rose, MD 05/21/15 [phone]

## 2015-05-20 NOTE — ED Notes (Signed)
Pt ambulatory to triage with c/o LLQ pain, pelvic pain and lower back pain. Pt states my "tubes are clamped", pt reports late period with spotting yesterday and pink mucous. Pt unsure if pregnant.

## 2015-05-21 LAB — ABO/RH: ABO/RH(D): A POS

## 2015-05-21 NOTE — Discharge Instructions (Signed)
Pelvic Pain Female pelvic pain can be caused by many different things and start from a variety of places. Pelvic pain refers to pain that is located in the lower half of the abdomen and between your hips. The pain may occur over a short period of time (acute) or may be reoccurring (chronic). The cause of pelvic pain may be related to disorders affecting the female reproductive organs (gynecologic), but it may also be related to the bladder, kidney stones, an intestinal complication, or muscle or skeletal problems. Getting help right away for pelvic pain is important, especially if there has been severe, sharp, or a sudden onset of unusual pain. It is also important to get help right away because some types of pelvic pain can be life threatening.  CAUSES  Below are only some of the causes of pelvic pain. The causes of pelvic pain can be in one of several categories.   Gynecologic.  Pelvic inflammatory disease.  Sexually transmitted infection.  Ovarian cyst or a twisted ovarian ligament (ovarian torsion).  Uterine lining that grows outside the uterus (endometriosis).  Fibroids, cysts, or tumors.  Ovulation.  Pregnancy.  Pregnancy that occurs outside the uterus (ectopic pregnancy).  Miscarriage.  Labor.  Abruption of the placenta or ruptured uterus.  Infection.  Uterine infection (endometritis).  Bladder infection.  Diverticulitis.  Miscarriage related to a uterine infection (septic abortion).  Bladder.  Inflammation of the bladder (cystitis).  Kidney stone(s).  Gastrointestinal.  Constipation.  Diverticulitis.  Neurologic.  Trauma.  Feeling pelvic pain because of mental or emotional causes (psychosomatic).  Cancers of the bowel or pelvis. EVALUATION  Your caregiver will want to take a careful history of your concerns. This includes recent changes in your health, a careful gynecologic history of your periods (menses), and a sexual history. Obtaining your family  history and medical history is also important. Your caregiver may suggest a pelvic exam. A pelvic exam will help identify the location and severity of the pain. It also helps in the evaluation of which organ system may be involved. In order to identify the cause of the pelvic pain and be properly treated, your caregiver may order tests. These tests may include:   A pregnancy test.  Pelvic ultrasonography.  An X-ray exam of the abdomen.  A urinalysis or evaluation of vaginal discharge.  Blood tests. HOME CARE INSTRUCTIONS   Only take over-the-counter or prescription medicines for pain, discomfort, or fever as directed by your caregiver.   Rest as directed by your caregiver.   Eat a balanced diet.   Drink enough fluids to make your urine clear or pale yellow, or as directed.   Avoid sexual intercourse if it causes pain.   Apply warm or cold compresses to the lower abdomen depending on which one helps the pain.   Avoid stressful situations.   Keep a journal of your pelvic pain. Write down when it started, where the pain is located, and if there are things that seem to be associated with the pain, such as food or your menstrual cycle.  Follow up with your caregiver as directed.  SEEK MEDICAL CARE IF:  Your medicine does not help your pain.  You have abnormal vaginal discharge. SEEK IMMEDIATE MEDICAL CARE IF:   You have heavy bleeding from the vagina.   Your pelvic pain increases.   You feel light-headed or faint.   You have chills.   You have pain with urination or blood in your urine.   You have uncontrolled diarrhea   or vomiting.   You have a fever or persistent symptoms for more than 3 days.  You have a fever and your symptoms suddenly get worse.   You are being physically or sexually abused.  MAKE SURE YOU:  Understand these instructions.  Will watch your condition.  Will get help if you are not doing well or get worse. Document Released:  10/07/2004 Document Revised: 03/27/2014 Document Reviewed: 03/01/2012 Exit[employer] Patient Information 2015 [employer]. This information is not intended to replace advice given to you by your health care provider. Make sure you discuss any questions you have with your health care provider.  

## 2015-05-21 NOTE — ED Notes (Addendum)
Pt uprite on stretcher in exam room with no distress noted; pt reports left "ovarian" pain with vag spotting since yesterday; pt reports that she is concerned that she is pregnant, stating "I have always had a negative urine and blood test with all of my pregnancies and the only way they know is if I have an ultrasound"; abd soft, nondist, slightly tender to left lower with palpation; pt denies any accomp symptoms

## 2016-01-22 ENCOUNTER — Encounter: Payer: Self-pay | Admitting: Emergency Medicine

## 2016-01-22 ENCOUNTER — Emergency Department: Payer: Medicaid Other

## 2016-01-22 DIAGNOSIS — R079 Chest pain, unspecified: Secondary | ICD-10-CM | POA: Diagnosis present

## 2016-01-22 DIAGNOSIS — R0789 Other chest pain: Secondary | ICD-10-CM | POA: Diagnosis not present

## 2016-01-22 DIAGNOSIS — Z87891 Personal history of nicotine dependence: Secondary | ICD-10-CM | POA: Diagnosis not present

## 2016-01-22 LAB — BASIC METABOLIC PANEL
Anion gap: 8 (ref 5–15)
BUN: 14 mg/dL (ref 6–20)
CO2: 26 mmol/L (ref 22–32)
Calcium: 8.8 mg/dL — ABNORMAL LOW (ref 8.9–10.3)
Chloride: 105 mmol/L (ref 101–111)
Creatinine, Ser: 0.79 mg/dL (ref 0.44–1.00)
GFR calc Af Amer: >60 mL/min (ref >60)
GFR calc non Af Amer: >60 mL/min (ref >60)
Glucose, Bld: 95 mg/dL (ref 65–99)
Potassium: 3.7 mmol/L (ref 3.5–5.1)
Sodium: 139 mmol/L (ref 135–145)

## 2016-01-22 LAB — TROPONIN I: Troponin I: <0.03 ng/mL (ref <0.031)

## 2016-01-22 LAB — CBC
HCT: 35.8 % (ref 35.0–47.0)
Hemoglobin: 11.8 g/dL — ABNORMAL LOW (ref 12.0–16.0)
MCH: 26.8 pg (ref 26.0–34.0)
MCHC: 33 g/dL (ref 32.0–36.0)
MCV: 81.2 fL (ref 80.0–100.0)
PLATELETS: 404 10*3/uL (ref 150–440)
RBC: 4.41 MIL/uL (ref 3.80–5.20)
RDW: 14.9 % — ABNORMAL HIGH (ref 11.5–14.5)
WBC: 12.6 10*3/uL — ABNORMAL HIGH (ref 3.6–11.0)

## 2016-01-22 NOTE — ED Notes (Signed)
C/o "severe chest pains x 24 hours"  Feels like lungs are filling up with water with inspiration.  Denies cough or fever.

## 2016-01-23 ENCOUNTER — Emergency Department
Admission: EM | Admit: 2016-01-23 | Discharge: 2016-01-23 | Disposition: A | Discharge status: Home / Self Care | Payer: Medicaid Other | Attending: Emergency Medicine | Admitting: Emergency Medicine

## 2016-01-23 DIAGNOSIS — R0789 Other chest pain: Secondary | ICD-10-CM

## 2016-01-23 DIAGNOSIS — R079 Chest pain, unspecified: Secondary | ICD-10-CM

## 2016-01-23 LAB — FIBRIN DERIVATIVES D-DIMER (ARMC ONLY): FIBRIN DERIVATIVES D-DIMER (ARMC): 451 (ref 0–499)

## 2016-01-23 MED ORDER — ALBUTEROL SULFATE (2.5 MG/3ML) 0.083% IN NEBU
2.5000 mg | INHALATION_SOLUTION | Freq: Once | RESPIRATORY_TRACT | Status: AC
Start: 2016-01-23 — End: 2016-01-23
  Administered 2016-01-23: 2.5 mg via RESPIRATORY_TRACT
  Filled 2016-01-23: qty 3

## 2016-01-23 MED ORDER — ALBUTEROL SULFATE HFA 108 (90 BASE) MCG/ACT IN AERS: 2.0000 | INHALATION_SPRAY | RESPIRATORY_TRACT | Status: AC | PRN

## 2016-01-23 MED ORDER — KETOROLAC TROMETHAMINE 30 MG/ML IJ SOLN
60.0000 mg | Freq: Once | INTRAMUSCULAR | Status: AC
  Administered 2016-01-23: 60 mg via INTRAMUSCULAR
  Filled 2016-01-23: qty 2

## 2016-01-23 MED ORDER — NAPROXEN 500 MG PO TABS: 500.0000 mg | ORAL_TABLET | Freq: Two times a day (BID) | ORAL | Status: DC

## 2016-01-23 NOTE — Discharge Instructions (Signed)
1. You may take pain medicine as needed (Naprosyn #[id number]). 2. Apply moist heat to affected area several times daily. 3. Use albuterol inhaler 2 puffs every 4 hours as needed for difficulty breathing. 4. Return to the ER for worsening symptoms, persistent vomiting, difficulty breathing or other concerns.  Chest Wall Pain Chest wall pain is pain in or around the bones and muscles of your chest. Sometimes, an injury causes this pain. Sometimes, the cause may not be known. This pain may take several weeks or longer to get better. HOME CARE INSTRUCTIONS  Pay attention to any changes in your symptoms. Take these actions to help with your pain:   Rest as told by your health care provider.   Avoid activities that cause pain. These include any activities that use your chest muscles or your abdominal and side muscles to lift heavy items.   If directed, apply ice to the painful area:  Put ice in a plastic bag.  Place a towel between your skin and the bag.  Leave the ice on for 20 minutes, 2-3 times per day.  Take over-the-counter and prescription medicines only as told by your health care provider.  Do not use tobacco products, including cigarettes, chewing tobacco, and e-cigarettes. If you need help quitting, ask your health care provider.  Keep all follow-up visits as told by your health care provider. This is important. SEEK MEDICAL CARE IF:  You have a fever.  Your chest pain becomes worse.  You have new symptoms. SEEK IMMEDIATE MEDICAL CARE IF:  You have nausea or vomiting.  You feel sweaty or light-headed.  You have a cough with phlegm (sputum) or you cough up blood.  You develop shortness of breath.   This information is not intended to replace advice given to you by your health care provider. Make sure you discuss any questions you have with your health care provider.   Document Released: 11/10/2005 Document Revised: 08/01/2015 Document Reviewed: 02/05/2015 Elsevier  Interactive Patient Education 2016 Elsevier Inc.  Nonspecific Chest Pain  Chest pain can be caused by many different conditions. There is always a chance that your pain could be related to something serious, such as a heart attack or a blood clot in your lungs. Chest pain can also be caused by conditions that are not life-threatening. If you have chest pain, it is very important to follow up with your health care provider. CAUSES  Chest pain can be caused by:  Heartburn.  Pneumonia or bronchitis.  Anxiety or stress.  Inflammation around your heart (pericarditis) or lung (pleuritis or pleurisy).  A blood clot in your lung.  A collapsed lung (pneumothorax). It can develop suddenly on its own (spontaneous pneumothorax) or from trauma to the chest.  Shingles infection (varicella-zoster virus).  Heart attack.  Damage to the bones, muscles, and cartilage that make up your chest wall. This can include:  Bruised bones due to injury.  Strained muscles or cartilage due to frequent or repeated coughing or overwork.  Fracture to one or more ribs.  Sore cartilage due to inflammation (costochondritis). RISK FACTORS  Risk factors for chest pain may include:  Activities that increase your risk for trauma or injury to your chest.  Respiratory infections or conditions that cause frequent coughing.  Medical conditions or overeating that can cause heartburn.  Heart disease or family history of heart disease.  Conditions or health behaviors that increase your risk of developing a blood clot.  Having had chicken pox (varicella zoster). SIGNS AND  SYMPTOMS Chest pain can feel like:  Burning or tingling on the surface of your chest or deep in your chest.  Crushing, pressure, aching, or squeezing pain.  Dull or sharp pain that is worse when you move, cough, or take a deep breath.  Pain that is also felt in your back, neck, shoulder, or arm, or pain that spreads to any of these  areas. Your chest pain may come and go, or it may stay constant. DIAGNOSIS Lab tests or other studies may be needed to find the cause of your pain. Your health care provider may have you take a test called an ambulatory ECG (electrocardiogram). An ECG records your heartbeat patterns at the time the test is performed. You may also have other tests, such as:  Transthoracic echocardiogram (TTE). During echocardiography, sound waves are used to create a picture of all of the heart structures and to look at how blood flows through your heart.  Transesophageal echocardiogram (TEE).This is a more advanced imaging test that obtains images from inside your body. It allows your health care provider to see your heart in finer detail.  Cardiac monitoring. This allows your health care provider to monitor your heart rate and rhythm in real time.  Holter monitor. This is a portable device that records your heartbeat and can help to diagnose abnormal heartbeats. It allows your health care provider to track your heart activity for several days, if needed.  Stress tests. These can be done through exercise or by taking medicine that makes your heart beat more quickly.  Blood tests.  Imaging tests. TREATMENT  Your treatment depends on what is causing your chest pain. Treatment may include:  Medicines. These may include:  Acid blockers for heartburn.  Anti-inflammatory medicine.  Pain medicine for inflammatory conditions.  Antibiotic medicine, if an infection is present.  Medicines to dissolve blood clots.  Medicines to treat coronary artery disease.  Supportive care for conditions that do not require medicines. This may include:  Resting.  Applying heat or cold packs to injured areas.  Limiting activities until pain decreases. HOME CARE INSTRUCTIONS  If you were prescribed an antibiotic medicine, finish it all even if you start to feel better.  Avoid any activities that bring on chest  pain.  Do not use any tobacco products, including cigarettes, chewing tobacco, or electronic cigarettes. If you need help quitting, ask your health care provider.  Do not drink alcohol.  Take medicines only as directed by your health care provider.  Keep all follow-up visits as directed by your health care provider. This is important. This includes any further testing if your chest pain does not go away.  If heartburn is the cause for your chest pain, you may be told to keep your head raised (elevated) while sleeping. This reduces the chance that acid will go from your stomach into your esophagus.  Make lifestyle changes as directed by your health care provider. These may include:  Getting regular exercise. Ask your health care provider to suggest some activities that are safe for you.  Eating a heart-healthy diet. A registered dietitian can help you to learn healthy eating options.  Maintaining a healthy weight.  Managing diabetes, if necessary.  Reducing stress. SEEK MEDICAL CARE IF:  Your chest pain does not go away after treatment.  You have a rash with blisters on your chest.  You have a fever. SEEK IMMEDIATE MEDICAL CARE IF:   Your chest pain is worse.  You have an increasing cough,  or you cough up blood.  You have severe abdominal pain.  You have severe weakness.  You faint.  You have chills.  You have sudden, unexplained chest discomfort.  You have sudden, unexplained discomfort in your arms, back, neck, or jaw.  You have shortness of breath at any time.  You suddenly start to sweat, or your skin gets clammy.  You feel nauseous or you vomit.  You suddenly feel light-headed or dizzy.  Your heart begins to beat quickly, or it feels like it is skipping beats. These symptoms may represent a serious problem that is an emergency. Do not wait to see if the symptoms will go away. Get medical help right away. Call your local emergency services (911 in the  [place] Do not drive yourself to the hospital.   This information is not intended to replace advice given to you by your health care provider. Make sure you discuss any questions you have with your health care provider.   Document Released: 08/20/2005 Document Revised: 12/01/2014 Document Reviewed: 06/16/2014 Elsevier Interactive Patient Education [employer]

## 2016-01-23 NOTE — ED Notes (Signed)
Lab called to add on D-dimer.

## 2016-01-23 NOTE — ED Provider Notes (Signed)
[facility] Emergency Department Provider Note  ____________________________________________  Time seen: Approximately 2:07 AM  I have reviewed the triage vital signs and the nursing notes.   HISTORY  Chief Complaint Chest Pain    HPI Jasmine Bond is a 30 y.o. female who presents to the ED from home with a chief complaint of chest pain. Complains of "severe chest pains 24 hours". Describes central pressure/tightness which is worsened on supine position and deep inspiration. Patient describes her lungs feel like they are filling up with water when she lays flat. Denies associated symptoms of fever, chills, cough, shortness of breath, diaphoresis, nausea, vomiting, palpitations. Denies recent trauma. Traveled recently 5 hour car ride back and forth to [place]. Also patient was with flulike symptoms approximately 2-3 weeks ago.+ sick contacts.   Past medical history None  There are no active problems to display for this patient.   Past Surgical History  Procedure Laterality Date  . Laparoscopic tubal ligation w/ filchie clips    . Cholecystectomy      No current outpatient prescriptions on file.  Allergies Review of patient's allergies indicates no known allergies.  No family history on file.  Social History Social History  Substance Use Topics  . Smoking status: Former Smoker -- 1.00 packs/day    Types: Cigarettes  . Smokeless tobacco: None  . Alcohol Use: No  Only quit smoking 3 weeks ago  Review of Systems  Constitutional: No fever/chills. Eyes: No visual changes. ENT: No sore throat. Cardiovascular: Positive for chest pain. Respiratory: Denies shortness of breath. Gastrointestinal: No abdominal pain.  No nausea, no vomiting.  No diarrhea.  No constipation. Genitourinary: Negative for dysuria. Musculoskeletal: Negative for back pain. Skin: Negative for rash. Neurological: Negative for headaches, focal weakness or  numbness.  10-point ROS otherwise negative.  ____________________________________________   PHYSICAL EXAM:  VITAL SIGNS: ED Triage Vitals  Enc Vitals Group     BP 01/22/16 2219 118/73 mmHg     Pulse Rate 01/22/16 2217 80     Resp 01/22/16 2217 16     Temp 01/22/16 2217 97.8 F (36.6 C)     Temp Source 01/22/16 2217 Oral     SpO2 01/22/16 2217 99 %     Weight 01/22/16 2217 240 lb (108.863 kg)     Height 01/22/16 2217  (1.626 m)     Head Cir --      Peak Flow --      Pain Score 01/22/16 2217 6     Pain Loc --      Pain Edu? --      Excl. in GC? --     Constitutional: Alert and oriented. Well appearing and in no acute distress. Eyes: Conjunctivae are normal. PERRL. EOMI. Head: Atraumatic. Nose: No congestion/rhinnorhea. Mouth/Throat: Mucous membranes are moist.  Oropharynx non-erythematous. Neck: No stridor.   Cardiovascular: Normal rate, regular rhythm. Grossly normal heart sounds.  Good peripheral circulation. Respiratory: Normal respiratory effort.  No retractions. Lungs mildly diminished at both bases. Anterior chest wall tender to palpation and also with movement of trunk. Gastrointestinal: Soft and nontender. No distention. No abdominal bruits. No CVA tenderness. Musculoskeletal: No lower extremity tenderness nor edema.  No joint effusions. Neurologic:  Normal speech and language. No gross focal neurologic deficits are appreciated. No gait instability. Skin:  Skin is warm, dry and intact. No rash noted. Psychiatric: Mood and affect are normal. Speech and behavior are normal.  ____________________________________________   LABS (all labs ordered are  listed, but only abnormal results are displayed)  Labs Reviewed  BASIC METABOLIC PANEL - Abnormal; Notable for the following:    Calcium 8.8 (*)    All other components within normal limits  CBC - Abnormal; Notable for the following:    WBC 12.6 (*)    Hemoglobin 11.8 (*)    RDW 14.9 (*)    All other  components within normal limits  TROPONIN I  FIBRIN DERIVATIVES D-DIMER ([facility] ONLY)   ____________________________________________  EKG  ED ECG REPORT I, Milyn Stapleton J, the attending physician, personally viewed and interpreted this ECG.   Date: 01/23/2016  EKG Time: 2215  Rate: 87  Rhythm: normal EKG, normal sinus rhythm  Axis: Normal  Intervals:none  ST&T Change: Nonspecific  ____________________________________________  RADIOLOGY  Chest 2 view (viewed by me, interpreted per Dr. Karie Kirks): Mild bronchitic changes without focal consolidation. ____________________________________________   PROCEDURES  Procedure(s) performed: None  Critical Care performed: No  ____________________________________________   INITIAL IMPRESSION / ASSESSMENT AND PLAN / ED COURSE  Pertinent labs & imaging results that were available during my care of the patient were reviewed by me and considered in my medical decision making (see chart for details).  30 year old otherwise healthy female who presents with central chest discomfort 24 hours. Pain is reproducible on my exam. Lungs are slightly diminished bibasilarly; will administer albuterol nebulizer. Given patient's recent car travel, will check d-dimer. Low suspicion for ACS, PE, dissection.  ----------------------------------------- 3:59 AM on 01/23/2016 -----------------------------------------  Patient improved. Updated patient and negative d-dimer. Strict return precautions given. Patient verbalizes understanding and agrees with plan of care. ____________________________________________   FINAL CLINICAL IMPRESSION(S) / ED DIAGNOSES  Final diagnoses:  Chest pain, unspecified chest pain type  Chest wall pain      Irean Hong, MD 01/23/16 [phone]

## 2016-02-19 ENCOUNTER — Encounter: Payer: Self-pay | Admitting: Emergency Medicine

## 2016-02-19 ENCOUNTER — Emergency Department: Payer: Medicaid Other

## 2016-02-19 ENCOUNTER — Emergency Department
Admission: EM | Admit: 2016-02-19 | Discharge: 2016-02-19 | Disposition: A | Discharge status: Home / Self Care | Payer: Medicaid Other | Attending: Emergency Medicine | Admitting: Emergency Medicine

## 2016-02-19 DIAGNOSIS — M25562 Pain in left knee: Secondary | ICD-10-CM | POA: Diagnosis present

## 2016-02-19 DIAGNOSIS — Z79899 Other long term (current) drug therapy: Secondary | ICD-10-CM | POA: Insufficient documentation

## 2016-02-19 DIAGNOSIS — Z87891 Personal history of nicotine dependence: Secondary | ICD-10-CM | POA: Diagnosis not present

## 2016-02-19 DIAGNOSIS — M25462 Effusion, left knee: Secondary | ICD-10-CM | POA: Insufficient documentation

## 2016-02-19 MED ORDER — DICLOFENAC SODIUM 75 MG PO TBEC: 75.0000 mg | DELAYED_RELEASE_TABLET | Freq: Two times a day (BID) | ORAL | Status: AC

## 2016-02-19 MED ORDER — HYDROCODONE-ACETAMINOPHEN 5-325 MG PO TABS: 1.0000 | ORAL_TABLET | ORAL | Status: AC | PRN

## 2016-02-19 MED ORDER — KETOROLAC TROMETHAMINE 60 MG/2ML IM SOLN
60.0000 mg | Freq: Once | INTRAMUSCULAR | Status: AC
  Administered 2016-02-19: 60 mg via INTRAMUSCULAR
  Filled 2016-02-19: qty 2

## 2016-02-19 NOTE — Discharge Instructions (Signed)
Knee Effusion °Knee effusion means that you have excess fluid in your knee joint. This can cause pain and swelling in your knee. This may make your knee more difficult to bend and move. That is because there is increased pain and pressure in the joint. If there is fluid in your knee, it often means that something is wrong inside your knee, such as severe arthritis, abnormal inflammation, or an infection. Another common cause of knee effusion is an injury to the knee muscles, ligaments, or cartilage. °HOME CARE INSTRUCTIONS °· Use crutches as directed by your health care provider. °· Wear a knee brace as directed by your health care provider. °· Apply ice to the swollen area: °¨ Put ice in a plastic bag. °¨ Place a towel between your skin and the bag. °¨ Leave the ice on for 20 minutes, 2-3 times per day. °· Keep your knee raised (elevated) when you are sitting or lying down. °· Take medicines only as directed by your health care provider. °· Do any rehabilitation or strengthening exercises as directed by your health care provider. °· Rest your knee as directed by your health care provider. You may start doing your normal activities again when your health care provider approves.    °· Keep all follow-up visits as directed by your health care provider. This is important. °SEEK MEDICAL CARE IF: °· You have ongoing (persistent) pain in your knee. °SEEK IMMEDIATE MEDICAL CARE IF: °· You have increased swelling or redness of your knee. °· You have severe pain in your knee. °· You have a fever. °  °This information is not intended to replace advice given to you by your health care provider. Make sure you discuss any questions you have with your health care provider. °  °Document Released: 01/31/2004 Document Revised: 12/01/2014 Document Reviewed: 06/26/2014 °Elsevier Interactive Patient Education ©2016 Elsevier Inc. ° °

## 2016-02-19 NOTE — ED Notes (Signed)
Twisted her left knee a few days ago, states she can't take the pain and swelling anymore, can't bend knee.

## 2016-02-19 NOTE — ED Provider Notes (Signed)
[facility] Emergency Department Provider Note  ____________________________________________  Time seen: Approximately 1:05 PM  I have reviewed the triage vital signs and the nursing notes.   HISTORY  Chief Complaint Knee Pain    HPI Jasmine Bond is a 30 y.o. female presents for evaluation of left knee pain 14 days. Patient states that she originally injured this knee years ago but had a recent injury where she twisted her knee. Has been taking ibuprofen over-the-counter with good relief however ibuprofen is no longer working. Has point tenderness both medially and laterally of her left knee. Describes her pain as 10 over 10.   History reviewed. No pertinent past medical history.  There are no active problems to display for this patient.   Past Surgical History  Procedure Laterality Date  . Laparoscopic tubal ligation w/ filchie clips    . Cholecystectomy      Current Outpatient Rx  Name  Route  Sig  Dispense  Refill  . albuterol (PROVENTIL HFA;VENTOLIN HFA) 108 (90 Base) MCG/ACT inhaler   Inhalation   Inhale 2 puffs into the lungs every 4 (four) hours as needed for wheezing or shortness of breath.   1 Inhaler   0   . diclofenac (VOLTAREN) 75 MG EC tablet   Oral   Take 1 tablet (75 mg total) by mouth 2 (two) times daily.   60 tablet   0   . HYDROcodone-acetaminophen (NORCO) 5-325 MG tablet   Oral   Take 1-2 tablets by mouth every 4 (four) hours as needed for moderate pain.   15 tablet   0     Allergies Review of patient's allergies indicates no known allergies.  No family history on file.  Social History Social History  Substance Use Topics  . Smoking status: Former Smoker -- 1.00 packs/day    Types: Cigarettes  . Smokeless tobacco: None  . Alcohol Use: No    Review of Systems Constitutional: No fever/chills Musculoskeletal: Positive for left knee pain. Skin: Negative for rash. Neurological: Negative for headaches, focal  weakness or numbness.  10-point ROS otherwise negative.  ____________________________________________   PHYSICAL EXAM:  VITAL SIGNS: ED Triage Vitals  Enc Vitals Group     BP 02/19/16 1244 140/73 mmHg     Pulse Rate 02/19/16 1244 92     Resp 02/19/16 1244 18     Temp 02/19/16 1244 98.1 F (36.7 C)     Temp Source 02/19/16 1244 Oral     SpO2 02/19/16 1244 99 %     Weight 02/19/16 1244 260 lb (117.935 kg)     Height 02/19/16 1244 5\' 4"  (1.626 m)     Head Cir --      Peak Flow --      Pain Score 02/19/16 1245 10     Pain Loc --      Pain Edu? --      Excl. in GC? --     Constitutional: Alert and oriented. Well appearing and in no acute distress. Musculoskeletal: Left knee with positive tenderness medially and laterally positive valgus and vagus  Maneuver positive.  Neurologic:  Normal speech and language. No gross focal neurologic deficits are appreciated. Gait not tested secondary to pain. Skin:  Skin is warm, dry and intact. No rash noted. Psychiatric: Mood and affect are normal. Speech and behavior are normal.  ____________________________________________   LABS (all labs ordered are listed, but only abnormal results are displayed)  Labs Reviewed - No data to display  RADIOLOGY  Positive left knee effusion. No other acute osseous findings noted. ____________________________________________   PROCEDURES  Procedure(s) performed: None  Critical Care performed: No  ____________________________________________   INITIAL IMPRESSION / ASSESSMENT AND PLAN / ED COURSE  Pertinent labs & imaging results that were available during my care of the patient were reviewed by me and considered in my medical decision making (see chart for details).  Acute left knee strain with effusion noted. Referral to Dr. Martha Clan in orthopedics on call. Patient given Rx for diclofenac sodium 75 mg twice a day. Work excuse 48 hours. Patient voices no other emergency medical course  this time. ____________________________________________   FINAL CLINICAL IMPRESSION(S) / ED DIAGNOSES  Final diagnoses:  Knee effusion, left     This chart was dictated using voice recognition software/Dragon. Despite best efforts to proofread, errors can occur which can change the meaning. Any change was purely unintentional.   Evangeline Dakin, PA-C 02/19/16 1341  Rockne Menghini, MD 02/19/16 1553

## 2016-04-23 ENCOUNTER — Encounter: Payer: Self-pay | Admitting: Emergency Medicine

## 2016-04-23 ENCOUNTER — Emergency Department
Admission: EM | Admit: 2016-04-23 | Discharge: 2016-04-23 | Disposition: A | Discharge status: Home / Self Care | Payer: Medicaid Other | Attending: Emergency Medicine | Admitting: Emergency Medicine

## 2016-04-23 DIAGNOSIS — E876 Hypokalemia: Secondary | ICD-10-CM | POA: Diagnosis not present

## 2016-04-23 DIAGNOSIS — Z7951 Long term (current) use of inhaled steroids: Secondary | ICD-10-CM | POA: Diagnosis not present

## 2016-04-23 DIAGNOSIS — Z87891 Personal history of nicotine dependence: Secondary | ICD-10-CM | POA: Insufficient documentation

## 2016-04-23 DIAGNOSIS — N938 Other specified abnormal uterine and vaginal bleeding: Secondary | ICD-10-CM | POA: Diagnosis not present

## 2016-04-23 DIAGNOSIS — G43821 Menstrual migraine, not intractable, with status migrainosus: Secondary | ICD-10-CM | POA: Insufficient documentation

## 2016-04-23 LAB — BASIC METABOLIC PANEL
Anion gap: 9 (ref 5–15)
BUN: 9 mg/dL (ref 6–20)
CO2: 25 mmol/L (ref 22–32)
CREATININE: 0.69 mg/dL (ref 0.44–1.00)
Calcium: 9 mg/dL (ref 8.9–10.3)
Chloride: 106 mmol/L (ref 101–111)
GFR calc Af Amer: >60 mL/min (ref >60)
GFR calc non Af Amer: >60 mL/min (ref >60)
Glucose, Bld: 107 mg/dL — ABNORMAL HIGH (ref 65–99)
Potassium: 3.2 mmol/L — ABNORMAL LOW (ref 3.5–5.1)
SODIUM: 140 mmol/L (ref 135–145)

## 2016-04-23 LAB — CBC
HCT: 36.3 % (ref 35.0–47.0)
Hemoglobin: 11.8 g/dL — ABNORMAL LOW (ref 12.0–16.0)
MCH: 26.5 pg (ref 26.0–34.0)
MCHC: 32.6 g/dL (ref 32.0–36.0)
MCV: 81.2 fL (ref 80.0–100.0)
PLATELETS: 426 10*3/uL (ref 150–440)
RBC: 4.47 MIL/uL (ref 3.80–5.20)
RDW: 15 % — AB (ref 11.5–14.5)
WBC: 11.6 10*3/uL — ABNORMAL HIGH (ref 3.6–11.0)

## 2016-04-23 LAB — POCT PREGNANCY, URINE: PREG TEST UR: NEGATIVE

## 2016-04-23 MED ORDER — KETOROLAC TROMETHAMINE 10 MG PO TABS: 10.0000 mg | ORAL_TABLET | Freq: Three times a day (TID) | ORAL | Status: AC | PRN

## 2016-04-23 MED ORDER — POTASSIUM CHLORIDE CRYS ER 20 MEQ PO TBCR
40.0000 meq | EXTENDED_RELEASE_TABLET | Freq: Once | ORAL | Status: AC
  Administered 2016-04-23: 40 meq via ORAL
  Filled 2016-04-23: qty 2

## 2016-04-23 MED ORDER — SODIUM CHLORIDE 0.9 % IV BOLUS (SEPSIS)
1000.0000 mL | Freq: Once | INTRAVENOUS | Status: AC
  Administered 2016-04-23: 1000 mL via INTRAVENOUS

## 2016-04-23 MED ORDER — KETOROLAC TROMETHAMINE 30 MG/ML IJ SOLN
30.0000 mg | Freq: Once | INTRAMUSCULAR | Status: AC
  Administered 2016-04-23: 30 mg via INTRAVENOUS
  Filled 2016-04-23: qty 1

## 2016-04-23 MED ORDER — METOCLOPRAMIDE HCL 10 MG PO TABS: 10.0000 mg | ORAL_TABLET | Freq: Three times a day (TID) | ORAL | Status: AC | PRN

## 2016-04-23 MED ORDER — METOCLOPRAMIDE HCL 5 MG/ML IJ SOLN
10.0000 mg | Freq: Once | INTRAMUSCULAR | Status: AC
  Administered 2016-04-23: 10 mg via INTRAVENOUS
  Filled 2016-04-23: qty 2

## 2016-04-23 MED ORDER — METOCLOPRAMIDE HCL 5 MG/ML IJ SOLN
INTRAMUSCULAR | Status: AC
  Filled 2016-04-23: qty 2

## 2016-04-23 NOTE — Discharge Instructions (Signed)
Please return to the emergency department if you develop severe pain, visual changes, inability to keep down fluids, fever, or any other symptoms concerning to you.  Please make an appointment with your primary care physician to have your potassium rechecked.  Please make an appointment with the gynecologist for further evaluation of your bleeding and pain.  Dysfunctional Uterine Bleeding Dysfunctional uterine bleeding is abnormal bleeding from the uterus. Dysfunctional uterine bleeding includes:  A period that comes earlier or later than usual.  A period that is lighter, heavier, or has blood clots.  Bleeding between periods.  Skipping one or more periods.  Bleeding after sexual intercourse.  Bleeding after menopause. HOME CARE INSTRUCTIONS  Pay attention to any changes in your symptoms. Follow these instructions to help with your condition: Eating  Eat well-balanced meals. Include foods that are high in iron, such as liver, meat, shellfish, green leafy vegetables, and eggs.  If you become constipated:  Drink plenty of water.  Eat fruits and vegetables that are high in water and fiber, such as spinach, carrots, raspberries, apples, and mango. Medicines  Take over-the-counter and prescription medicines only as told by your health care provider.  Do not change medicines without talking with your health care provider.  Aspirin or medicines that contain aspirin may make the bleeding worse. Do not take those medicines:  During the week before your period.  During your period.  If you were prescribed iron pills, take them as told by your health care provider. Iron pills help to replace iron that your body loses because of this condition. Activity  If you need to change your sanitary pad or tampon more than one time every 2 hours:  Lie in bed with your feet raised (elevated).  Place a cold pack on your lower abdomen.  Rest as much as possible until the bleeding stops or  slows down.  Do not try to lose weight until the bleeding has stopped and your blood iron level is back to normal. Other Instructions  For two months, write down:  When your period starts.  When your period ends.  When any abnormal bleeding occurs.  What problems you notice.  Keep all follow up visits as told by your health care provider. This is important. SEEK MEDICAL CARE IF:  You get light-headed or weak.  You have nausea and vomiting.  You cannot eat or drink without vomiting.  You feel dizzy or have diarrhea while you are taking medicines.  You are taking birth control pills or hormones, and you want to change them or stop taking them. SEEK IMMEDIATE MEDICAL CARE IF:  You develop a fever or chills.  You need to change your sanitary pad or tampon more than one time per hour.  Your bleeding becomes heavier, or your flow contains clots more often.  You develop pain in your abdomen.  You lose consciousness.  You develop a rash.   This information is not intended to replace advice given to you by your health care provider. Make sure you discuss any questions you have with your health care provider.   Document Released: 11/07/2000 Document Revised: 08/01/2015 Document Reviewed: 02/05/2015 Elsevier Interactive Patient Education [employer] Elsevier Inc.

## 2016-04-23 NOTE — ED Notes (Signed)
Patient called about prescriptions that MD forgot to put on patient's chart prior to her being discharged. Patient said that she would be able to come back to ER to get them. Will leave them at front desk for patient to pick up.

## 2016-04-23 NOTE — ED Notes (Signed)
Pt was started on Progestin 5/26 and since that time has had severe abdominal cramping and then started having a Migraine on Sunday and is now having heavy vaginal bleeding.

## 2016-04-23 NOTE — ED Notes (Signed)
Patient states that  Headache has improved significantly, but abdominal cramping has increased. Patient states she has been able to sleep due to the decrease in head pain.

## 2016-04-23 NOTE — ED Provider Notes (Signed)
[facility] Emergency Department Provider Note  ____________________________________________  Time seen: Approximately 6:44 PM  I have reviewed the triage vital signs and the nursing notes.   HISTORY  Chief Complaint Migraine and Vaginal Bleeding    HPI Jasmine Bond is a 30 y.o. female , nonpregnant, with a long history of dysfunctional uterine bleeding presenting with migraine. The patient reports a long history of heavy periods with severe abdominal cramping, and is currently under treatment by OB/GYN for this. On 5/25 she was started on norethindrone, and since then she has developed which she considers intolerable side effects. She has had 4 days of a typical migraine that is not responsive to her typical Excedrin treatment. She has had associated nausea and vomiting, which have improved and today she was able to eat for the first time in several days. She has not had any fever, rash, trauma. She does report some mild depth perception abnormalities in her vision that are typical of severe migraines for her. She also reports continued and not improved vaginal bleeding.   History reviewed. No pertinent past medical history.  There are no active problems to display for this patient.   Past Surgical History  Procedure Laterality Date  . Laparoscopic tubal ligation w/ filchie clips    . Cholecystectomy      Current Outpatient Rx  Name  Route  Sig  Dispense  Refill  . albuterol (PROVENTIL HFA;VENTOLIN HFA) 108 (90 Base) MCG/ACT inhaler   Inhalation   Inhale 2 puffs into the lungs every 4 (four) hours as needed for wheezing or shortness of breath.   1 Inhaler   0   . diclofenac (VOLTAREN) 75 MG EC tablet   Oral   Take 1 tablet (75 mg total) by mouth 2 (two) times daily.   60 tablet   0   . HYDROcodone-acetaminophen (NORCO) 5-325 MG tablet   Oral   Take 1-2 tablets by mouth every 4 (four) hours as needed for moderate pain.   15 tablet   0      Allergies Review of patient's allergies indicates no known allergies.  History reviewed. No pertinent family history.  Social History Social History  Substance Use Topics  . Smoking status: Former Smoker -- 1.00 packs/day    Types: Cigarettes  . Smokeless tobacco: None  . Alcohol Use: No    Review of Systems Constitutional: No fever/chills.No lightheadedness or syncope. Eyes: No blurred or double vision. Positive depth perception difficulties. ENT: No sore throat. No congestion or rhinorrhea. Cardiovascular: Denies chest pain. Denies palpitations. Respiratory: Denies shortness of breath.  No cough. Gastrointestinal: No abdominal pain.  No nausea, no vomiting.  No diarrhea.  No constipation. Genitourinary: Negative for dysuria. Positive vaginal bleeding. Musculoskeletal: Negative for back pain. Skin: Negative for rash. Neurological: Positive for headaches. No focal numbness, tingling or weakness.   10-point ROS otherwise negative.  ____________________________________________   PHYSICAL EXAM:  VITAL SIGNS: ED Triage Vitals  Enc Vitals Group     BP 04/23/16 1746 146/87 mmHg     Pulse Rate 04/23/16 1746 98     Resp 04/23/16 1746 18     Temp 04/23/16 1746 98 F (36.7 C)     Temp Source 04/23/16 1746 Oral     SpO2 04/23/16 1746 99 %     Weight 04/23/16 1746 260 lb (117.935 kg)     Height 04/23/16 1746 5\' 4"  (1.626 m)     Head Cir --      Peak  Flow --      Pain Score 04/23/16 1747 10     Pain Loc --      Pain Edu? --      Excl. in GC? --     Constitutional: Alert and oriented. Well appearing and in no acute distress. Answers questions appropriately. Eyes: Conjunctivae are normal.  EOMI. PERRLA. No nystagmus. No scleral icterus.No conjunctival pallor. Head: Atraumatic. Nose: No congestion/rhinnorhea. Mouth/Throat: Mucous membranes are moist.  Neck: No stridor.  Supple.   Cardiovascular: Normal rate, regular rhythm. No murmurs, rubs or gallops.  Respiratory:  Normal respiratory effort.  No accessory muscle use or retractions. Lungs CTAB.  No wheezes, rales or ronchi. Gastrointestinal: Obese. Soft, nontender and nondistended.  No guarding or rebound.  No peritoneal signs. Musculoskeletal: No LE edema. No ttp in the calves or palpable cords.  Negative Homan's sign. Neurologic:  A&Ox3.  Speech is clear.  Face and smile are symmetric.  EOMI.  Moves all extremities well. Skin:  Skin is warm, dry and intact. No rash noted. No pallor. Psychiatric: Mood and affect are normal. Speech and behavior are normal.  Normal judgement.  ____________________________________________   LABS (all labs ordered are listed, but only abnormal results are displayed)  Labs Reviewed  CBC - Abnormal; Notable for the following:    WBC 11.6 (*)    Hemoglobin 11.8 (*)    RDW 15.0 (*)    All other components within normal limits  BASIC METABOLIC PANEL - Abnormal; Notable for the following:    Potassium 3.2 (*)    Glucose, Bld 107 (*)    All other components within normal limits  POC URINE PREG, ED  POCT PREGNANCY, URINE   ____________________________________________  EKG  Not indicated ____________________________________________  RADIOLOGY  No results found.  ____________________________________________   PROCEDURES  Procedure(s) performed: None  Critical Care performed: No ____________________________________________   INITIAL IMPRESSION / ASSESSMENT AND PLAN / ED COURSE  Pertinent labs & imaging results that were available during my care of the patient were reviewed by me and considered in my medical decision making (see chart for details).  30 y.o. female with a history of migraines presenting with a four-day migraine after the initiation of [place] and drawn. The patient has multiple complaints around to suction a uterine bleeding, but these are not the cause of her ER visits today, and she denies any new symptoms. She is here for the treatment of  her migraine that has not been responsive to her home over-the-counter medications. On my exam I do not see any evidence of a new acute process including intracranial bleeding or meningitis. We'll plan to treat her pain, and reevaluate her for discharge.  ____________________________________________  FINAL CLINICAL IMPRESSION(S) / ED DIAGNOSES  Final diagnoses:  Dysfunctional uterine bleeding  Menstrual migraine with status migrainosus, not intractable  Hypokalemia      NEW MEDICATIONS STARTED DURING THIS VISIT:  New Prescriptions   No medications on file     Rockne Menghini, MD 04/23/16 2012

## 2018-01-23 IMAGING — DX DG KNEE COMPLETE 4+V*L*
4 series · 4 of 4 positions shown · non-contrast
Comparison: None.

CLINICAL DATA: Twisted knee a few days ago. Persistent pain and
swelling.

EXAM:
LEFT KNEE - COMPLETE 4+ VIEW

[knee ap]
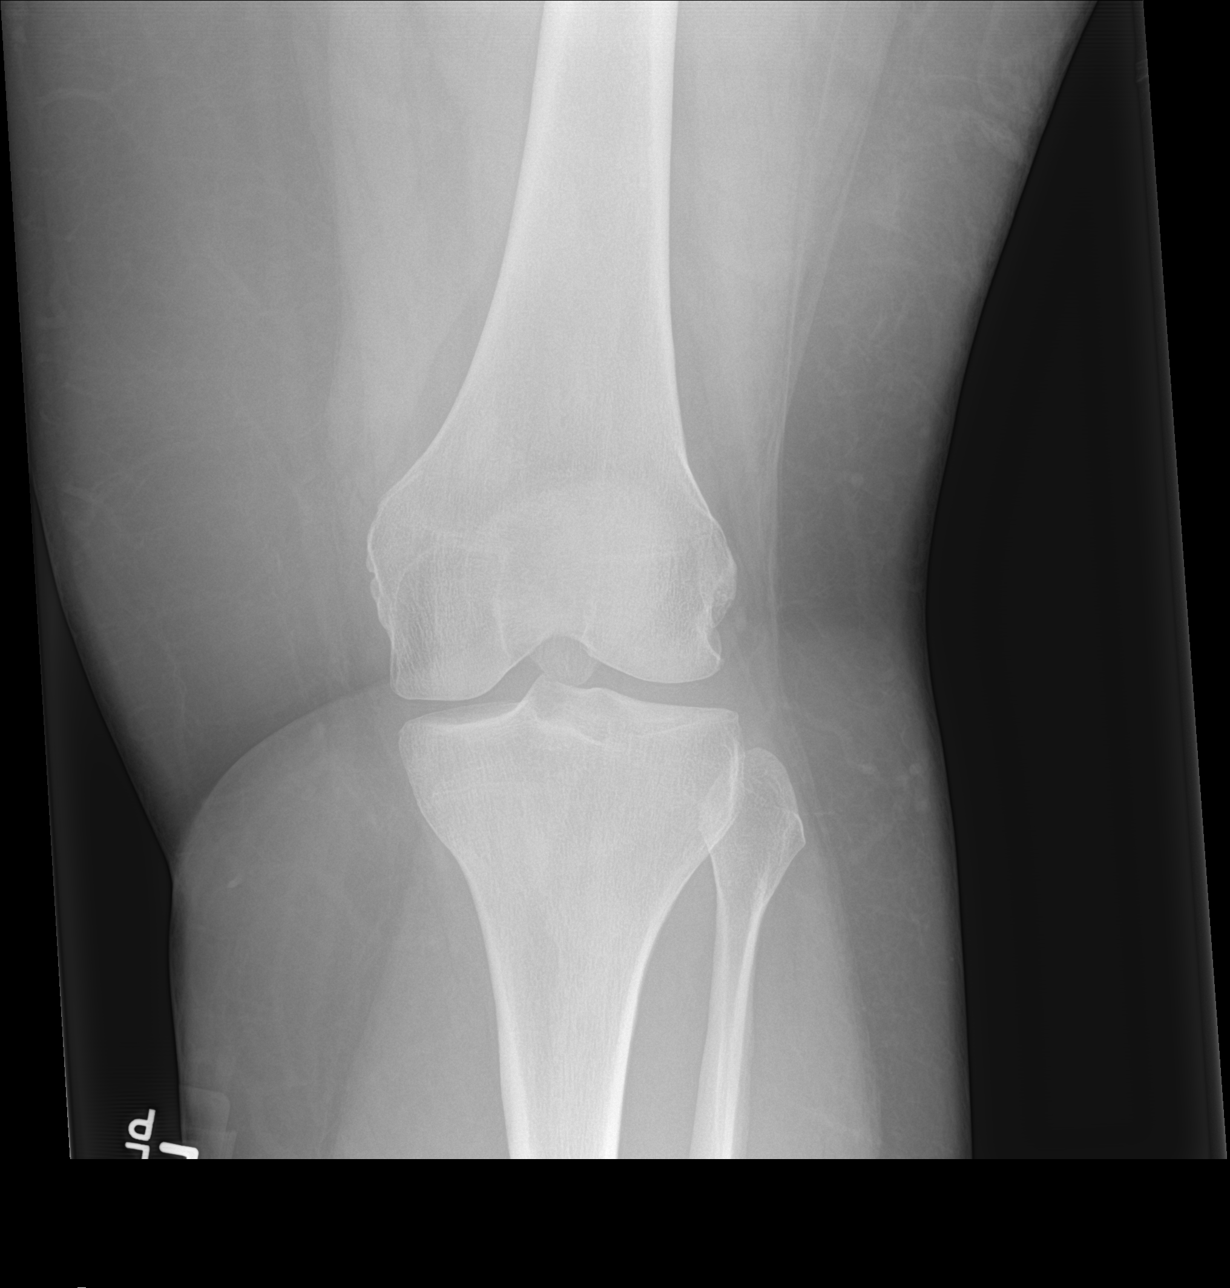

[knee lat]
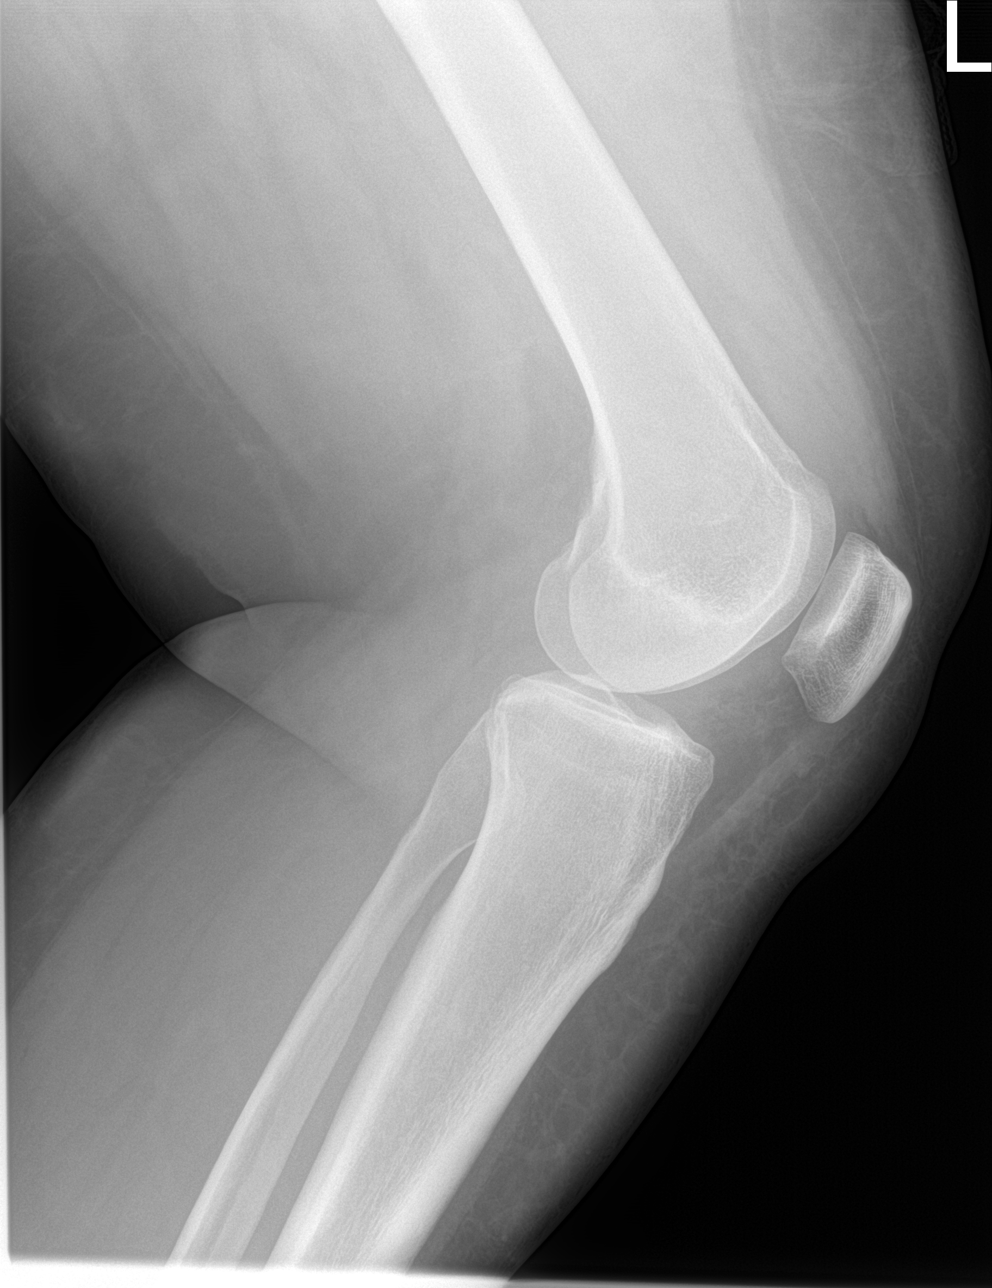

[knee obl (1 of 2)]
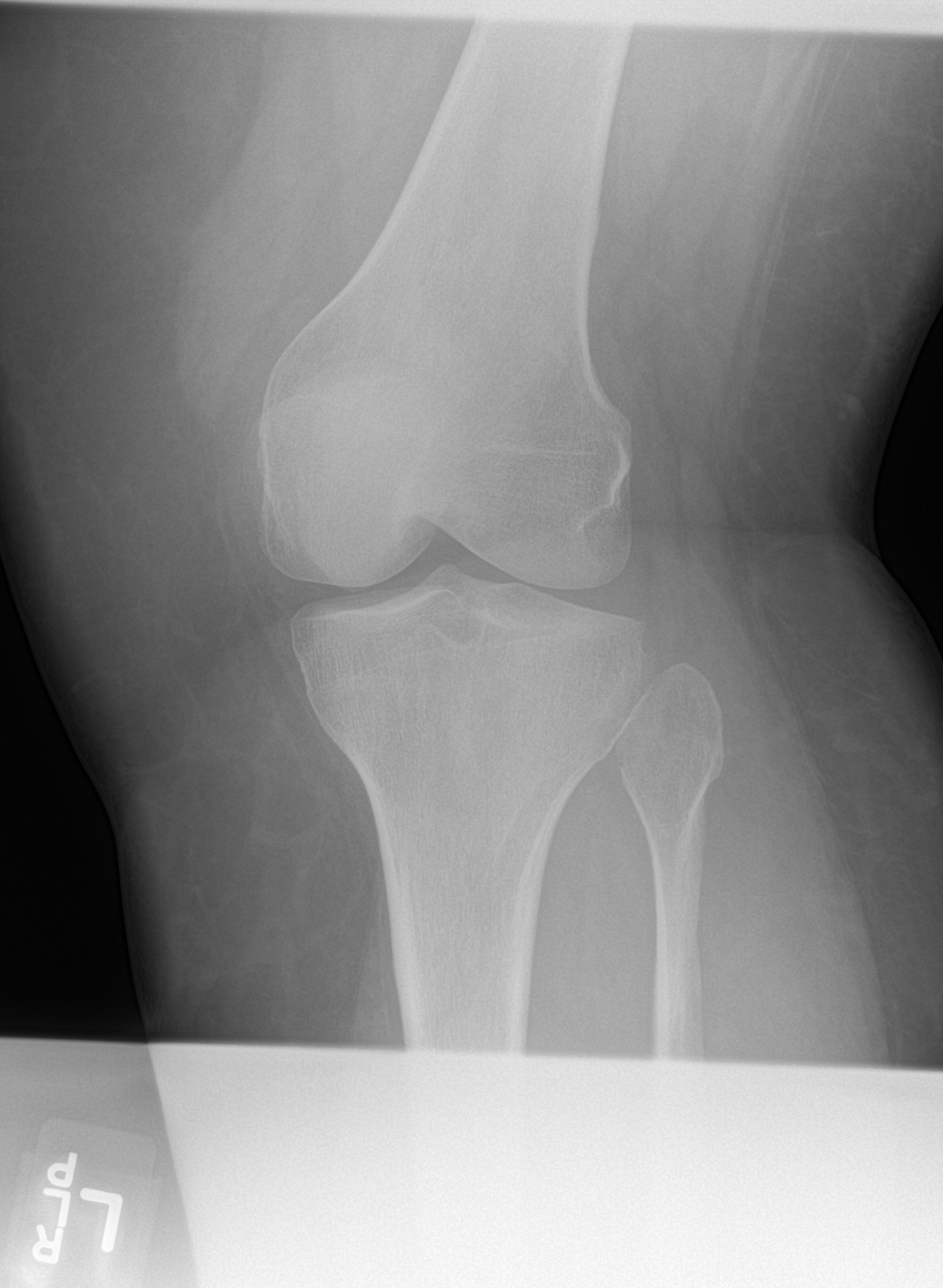

[knee obl (2 of 2)]
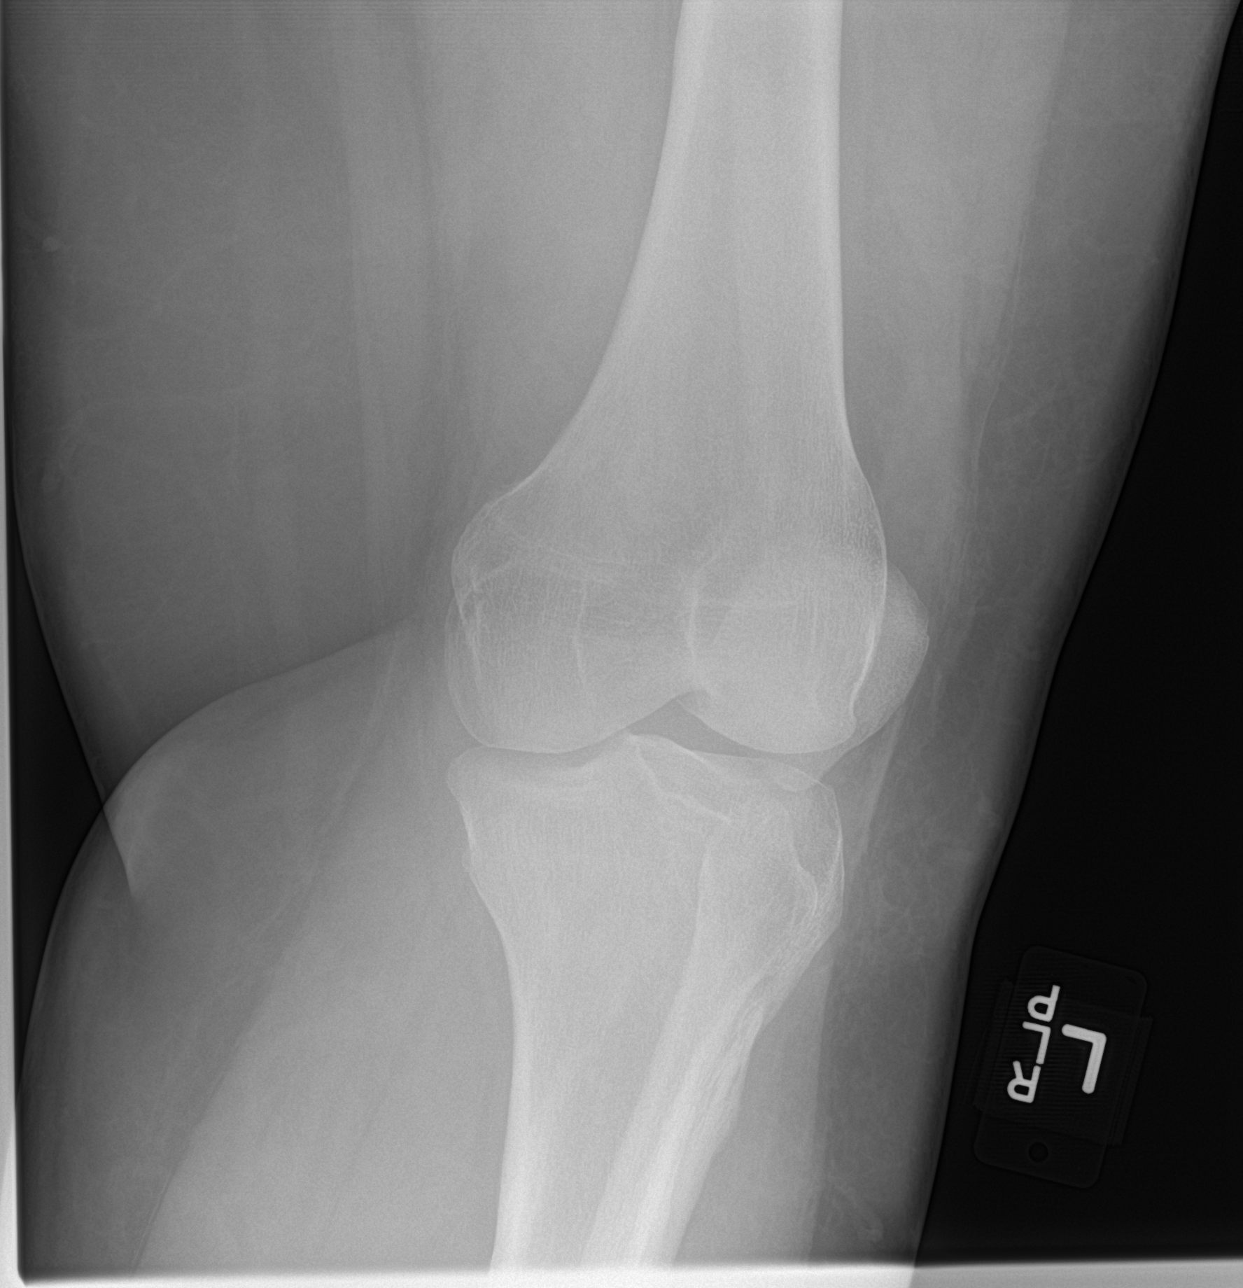

[4 of 4 positions shown; findings below may reference images not displayed]

FINDINGS: The joint spaces are maintained. No acute bony findings or
osteochondral abnormality. Small joint effusion is noted.
IMPRESSION: No acute bony findings or degenerative changes.

Small joint effusion.
